# Patient Record
Sex: Female | Born: 1970 | Race: Black or African American | Hispanic: No | Marital: Single | Smoking: Never smoker
Health system: Southern US, Community
[De-identification: ages and names within clinical notes are randomized; demographics above are authoritative.]

## PROBLEM LIST (undated history)

## (undated) DIAGNOSIS — L738 Other specified follicular disorders: Secondary | ICD-10-CM

## (undated) DIAGNOSIS — L678 Other hair color and hair shaft abnormalities: Secondary | ICD-10-CM

## (undated) DIAGNOSIS — K5150 Left sided colitis without complications: Secondary | ICD-10-CM

## (undated) DIAGNOSIS — A64 Unspecified sexually transmitted disease: Secondary | ICD-10-CM

## (undated) HISTORY — DX: Unspecified sexually transmitted disease: A64

## (undated) HISTORY — DX: Left sided colitis without complications: K51.50

## (undated) HISTORY — DX: Other hair color and hair shaft abnormalities: L67.8

## (undated) HISTORY — DX: Other specified follicular disorders: L73.8

---

## 1999-03-23 ENCOUNTER — Emergency Department (HOSPITAL_COMMUNITY): Admission: EM | Admit: 1999-03-23 | Discharge: 1999-03-23 | Payer: Self-pay | Admitting: Emergency Medicine

## 1999-09-17 ENCOUNTER — Encounter (INDEPENDENT_AMBULATORY_CARE_PROVIDER_SITE_OTHER): Payer: Self-pay

## 1999-09-17 ENCOUNTER — Other Ambulatory Visit: Admission: RE | Admit: 1999-09-17 | Discharge: 1999-09-17 | Payer: Self-pay | Admitting: Gynecology

## 2001-10-24 ENCOUNTER — Other Ambulatory Visit: Admission: RE | Admit: 2001-10-24 | Discharge: 2001-10-24 | Payer: Self-pay | Admitting: Gynecology

## 2002-10-31 ENCOUNTER — Other Ambulatory Visit: Admission: RE | Admit: 2002-10-31 | Discharge: 2002-10-31 | Payer: Self-pay | Admitting: Gynecology

## 2003-12-05 ENCOUNTER — Other Ambulatory Visit: Admission: RE | Admit: 2003-12-05 | Discharge: 2003-12-05 | Payer: Self-pay | Admitting: Gynecology

## 2005-01-07 ENCOUNTER — Other Ambulatory Visit: Admission: RE | Admit: 2005-01-07 | Discharge: 2005-01-07 | Payer: Self-pay | Admitting: Gynecology

## 2005-07-26 ENCOUNTER — Ambulatory Visit: Payer: Self-pay | Admitting: Endocrinology

## 2005-07-26 ENCOUNTER — Ambulatory Visit: Payer: Self-pay | Admitting: Gastroenterology

## 2005-07-29 ENCOUNTER — Ambulatory Visit: Payer: Self-pay | Admitting: Gastroenterology

## 2005-07-29 ENCOUNTER — Encounter (INDEPENDENT_AMBULATORY_CARE_PROVIDER_SITE_OTHER): Payer: Self-pay | Admitting: *Deleted

## 2005-08-24 ENCOUNTER — Ambulatory Visit: Payer: Self-pay | Admitting: Gastroenterology

## 2006-01-10 ENCOUNTER — Ambulatory Visit: Payer: Self-pay | Admitting: Gastroenterology

## 2006-01-10 ENCOUNTER — Other Ambulatory Visit: Admission: RE | Admit: 2006-01-10 | Discharge: 2006-01-10 | Payer: Self-pay | Admitting: Gynecology

## 2006-05-10 ENCOUNTER — Ambulatory Visit: Payer: Self-pay | Admitting: Internal Medicine

## 2006-09-01 ENCOUNTER — Emergency Department (HOSPITAL_COMMUNITY): Admission: EM | Admit: 2006-09-01 | Discharge: 2006-09-01 | Payer: Self-pay | Admitting: Emergency Medicine

## 2006-11-27 DIAGNOSIS — A64 Unspecified sexually transmitted disease: Secondary | ICD-10-CM

## 2006-11-27 HISTORY — DX: Unspecified sexually transmitted disease: A64

## 2007-01-12 ENCOUNTER — Other Ambulatory Visit: Admission: RE | Admit: 2007-01-12 | Discharge: 2007-01-12 | Payer: Self-pay | Admitting: Gynecology

## 2007-01-18 ENCOUNTER — Emergency Department (HOSPITAL_COMMUNITY): Admission: EM | Admit: 2007-01-18 | Discharge: 2007-01-18 | Payer: Self-pay | Admitting: Family Medicine

## 2007-02-22 ENCOUNTER — Ambulatory Visit: Payer: Self-pay | Admitting: Gastroenterology

## 2007-03-14 ENCOUNTER — Encounter: Payer: Self-pay | Admitting: Gastroenterology

## 2007-03-14 ENCOUNTER — Ambulatory Visit: Payer: Self-pay | Admitting: Gastroenterology

## 2007-03-21 ENCOUNTER — Ambulatory Visit: Payer: Self-pay | Admitting: Internal Medicine

## 2007-03-27 ENCOUNTER — Ambulatory Visit: Payer: Self-pay | Admitting: Gastroenterology

## 2007-04-04 ENCOUNTER — Ambulatory Visit: Payer: Self-pay | Admitting: Gastroenterology

## 2007-04-18 ENCOUNTER — Ambulatory Visit: Payer: Self-pay | Admitting: Gastroenterology

## 2007-04-21 ENCOUNTER — Emergency Department (HOSPITAL_COMMUNITY): Admission: EM | Admit: 2007-04-21 | Discharge: 2007-04-21 | Payer: Self-pay | Admitting: Family Medicine

## 2007-05-02 ENCOUNTER — Ambulatory Visit: Payer: Self-pay | Admitting: Gastroenterology

## 2007-05-08 ENCOUNTER — Telehealth (INDEPENDENT_AMBULATORY_CARE_PROVIDER_SITE_OTHER): Payer: Self-pay | Admitting: *Deleted

## 2007-05-17 ENCOUNTER — Ambulatory Visit: Payer: Self-pay | Admitting: Gastroenterology

## 2007-08-14 DIAGNOSIS — K5150 Left sided colitis without complications: Secondary | ICD-10-CM | POA: Insufficient documentation

## 2007-10-24 IMAGING — CR DG ANKLE COMPLETE 3+V*R*
2 series · 2 of 2 positions shown · non-contrast
Comparison: none

CLINICAL DATA: 35-year-old, fell. 
 RIGHT ANKLE - 3 VIEW:

[view not recorded (1 of 2)]
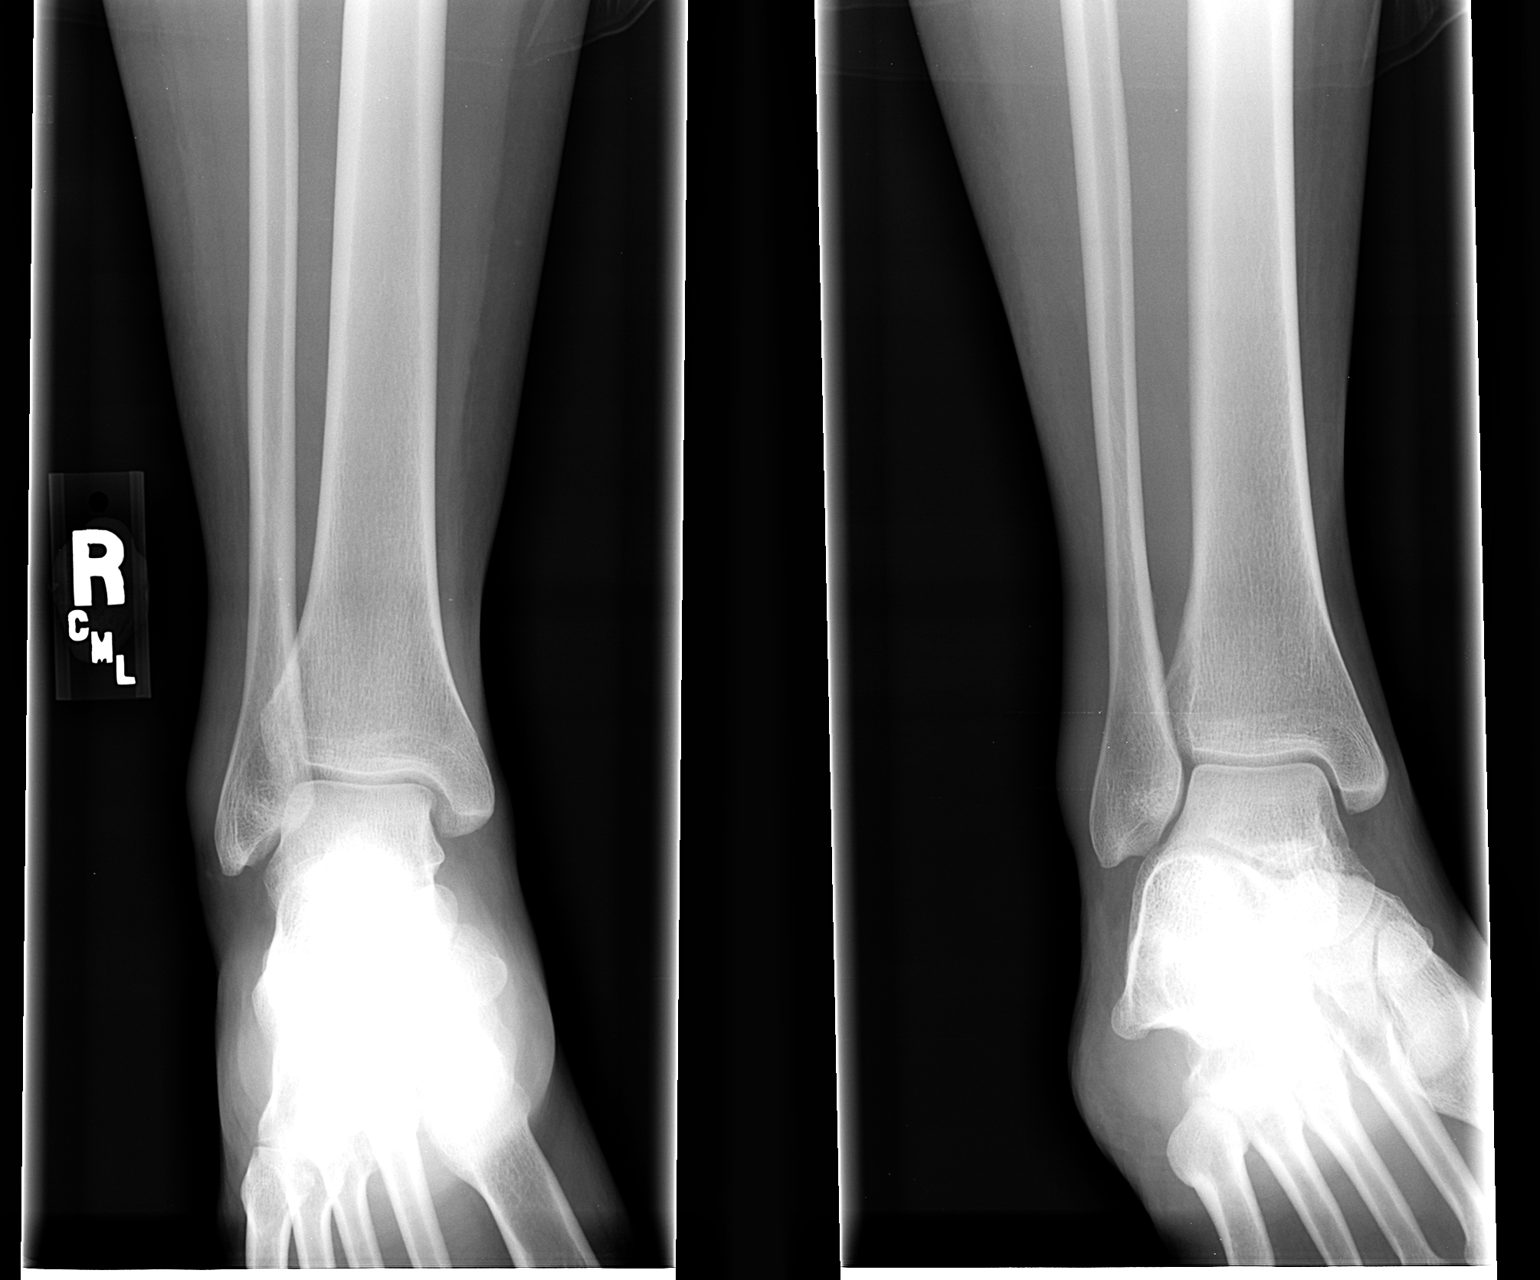

[view not recorded (2 of 2)]
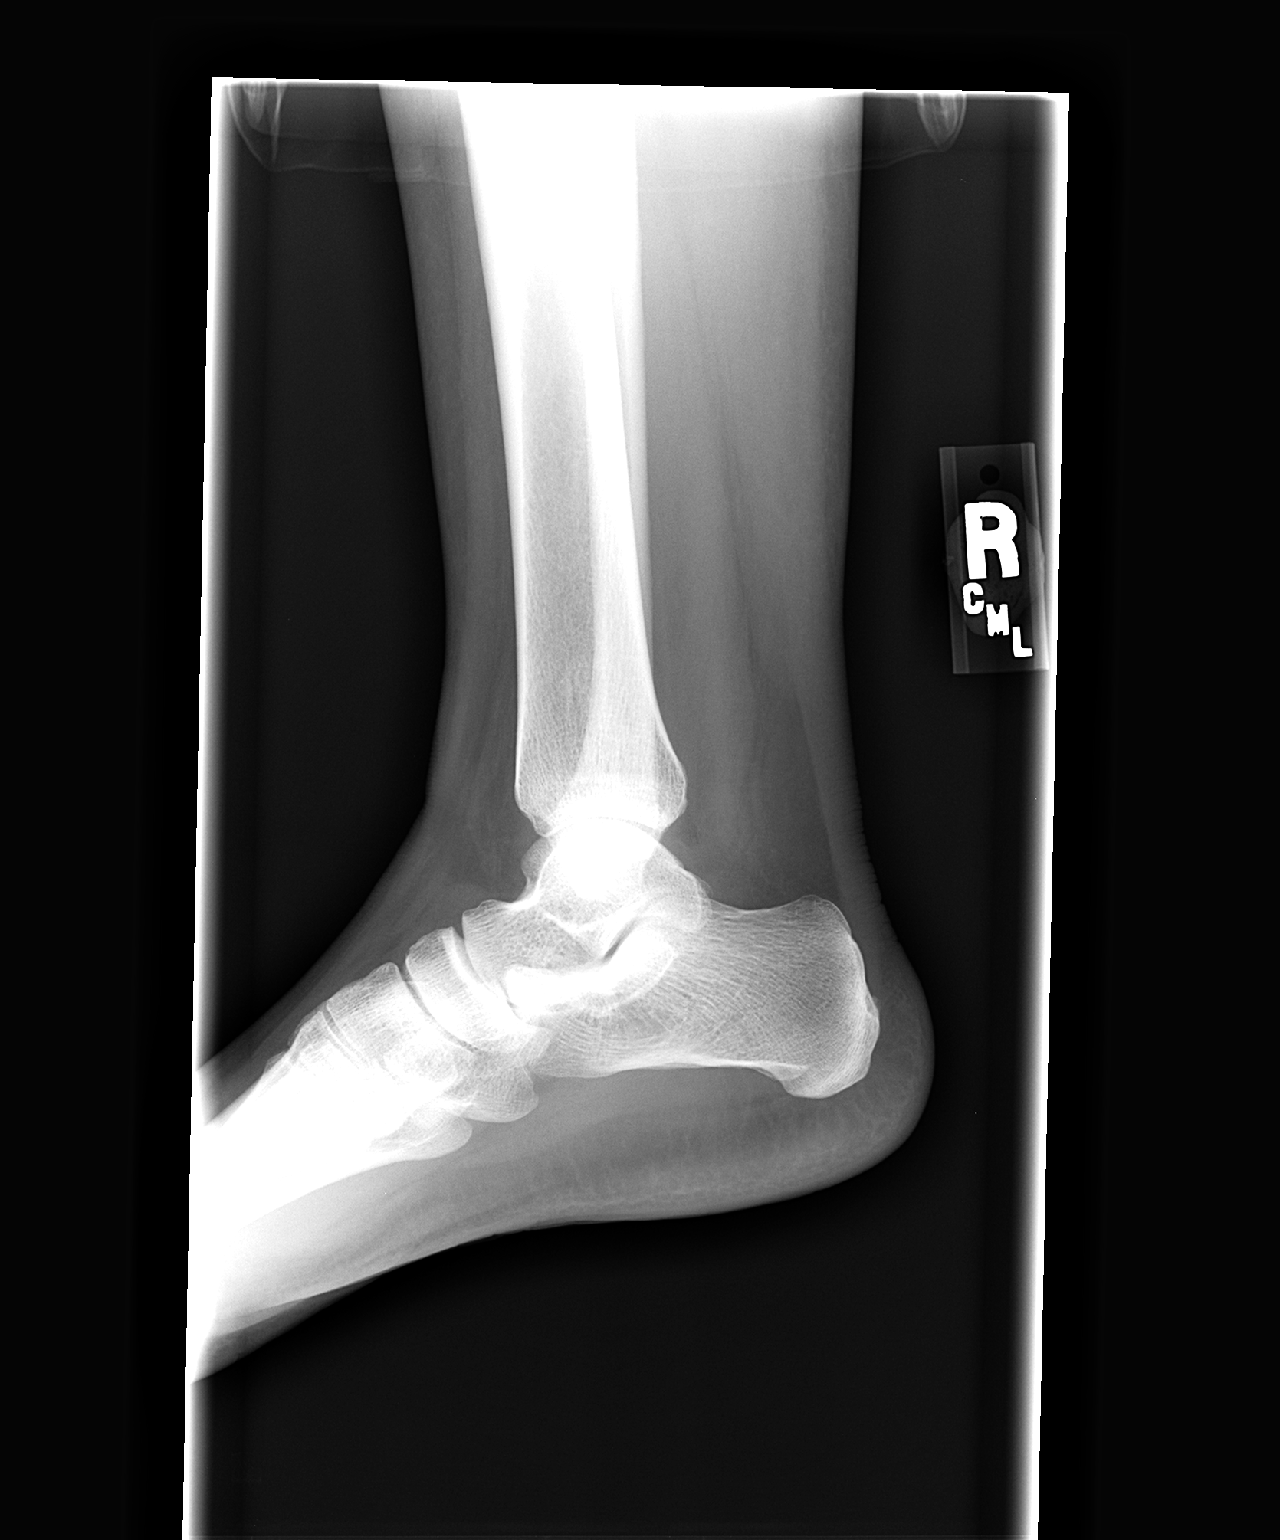

[2 of 2 positions shown; findings below may reference images not displayed]

FINDINGS: There is no evidence of fracture, dislocation, or joint effusion.  There is no evidence of arthropathy or other focal bone abnormality.  Soft tissues are unremarkable.
IMPRESSION: Negative.

## 2007-11-24 ENCOUNTER — Ambulatory Visit: Payer: Self-pay | Admitting: Endocrinology

## 2007-11-24 DIAGNOSIS — L738 Other specified follicular disorders: Secondary | ICD-10-CM | POA: Insufficient documentation

## 2008-01-18 ENCOUNTER — Other Ambulatory Visit: Admission: RE | Admit: 2008-01-18 | Discharge: 2008-01-18 | Payer: Self-pay | Admitting: Gynecology

## 2008-02-08 ENCOUNTER — Telehealth: Payer: Self-pay | Admitting: Gastroenterology

## 2008-05-02 ENCOUNTER — Ambulatory Visit: Payer: Self-pay | Admitting: Women's Health

## 2009-02-05 ENCOUNTER — Other Ambulatory Visit: Admission: RE | Admit: 2009-02-05 | Discharge: 2009-02-05 | Payer: Self-pay | Admitting: Gynecology

## 2009-02-05 ENCOUNTER — Ambulatory Visit: Payer: Self-pay | Admitting: Women's Health

## 2009-02-05 ENCOUNTER — Encounter: Payer: Self-pay | Admitting: Women's Health

## 2009-04-10 ENCOUNTER — Encounter: Payer: Self-pay | Admitting: Internal Medicine

## 2009-04-10 ENCOUNTER — Ambulatory Visit: Payer: Self-pay

## 2009-04-10 ENCOUNTER — Ambulatory Visit: Payer: Self-pay | Admitting: Endocrinology

## 2009-04-10 DIAGNOSIS — M79609 Pain in unspecified limb: Secondary | ICD-10-CM

## 2009-04-10 DIAGNOSIS — M25569 Pain in unspecified knee: Secondary | ICD-10-CM

## 2009-05-05 ENCOUNTER — Ambulatory Visit: Payer: Self-pay | Admitting: Gastroenterology

## 2009-05-05 ENCOUNTER — Encounter: Payer: Self-pay | Admitting: Endocrinology

## 2009-08-26 ENCOUNTER — Telehealth: Payer: Self-pay | Admitting: Gastroenterology

## 2010-01-15 ENCOUNTER — Ambulatory Visit: Payer: Self-pay | Admitting: Endocrinology

## 2010-01-15 DIAGNOSIS — D509 Iron deficiency anemia, unspecified: Secondary | ICD-10-CM

## 2010-01-15 DIAGNOSIS — B351 Tinea unguium: Secondary | ICD-10-CM

## 2010-01-15 DIAGNOSIS — E7800 Pure hypercholesterolemia, unspecified: Secondary | ICD-10-CM

## 2010-01-15 LAB — CONVERTED CEMR LAB
ALT: 16 units/L (ref 0–35)
AST: 14 units/L (ref 0–37)
BUN: 10 mg/dL (ref 6–23)
Basophils Relative: 0.5 % (ref 0.0–3.0)
Bilirubin, Direct: 0.1 mg/dL (ref 0.0–0.3)
Calcium: 9.3 mg/dL (ref 8.4–10.5)
Creatinine, Ser: 0.7 mg/dL (ref 0.4–1.2)
Eosinophils Relative: 2.3 % (ref 0.0–5.0)
GFR calc non Af Amer: 114.84 mL/min (ref >60)
Glucose, Bld: 83 mg/dL (ref 70–99)
HCT: 34.4 % — ABNORMAL LOW (ref 36.0–46.0)
HDL: 79.4 mg/dL (ref >39.00)
Lymphs Abs: 2.1 10*3/uL (ref 0.7–4.0)
MCV: 93.9 fL (ref 78.0–100.0)
Monocytes Absolute: 0.4 10*3/uL (ref 0.1–1.0)
Neutro Abs: 5 10*3/uL (ref 1.4–7.7)
RBC: 3.66 M/uL — ABNORMAL LOW (ref 3.87–5.11)
Total Bilirubin: 0.4 mg/dL (ref 0.3–1.2)
WBC: 7.7 10*3/uL (ref 4.5–10.5)

## 2010-01-16 ENCOUNTER — Ambulatory Visit: Payer: Self-pay | Admitting: Endocrinology

## 2010-01-19 ENCOUNTER — Telehealth: Payer: Self-pay | Admitting: Internal Medicine

## 2010-02-17 ENCOUNTER — Encounter: Payer: Self-pay | Admitting: Endocrinology

## 2010-03-05 ENCOUNTER — Encounter: Payer: Self-pay | Admitting: Endocrinology

## 2010-04-11 ENCOUNTER — Ambulatory Visit: Payer: Self-pay | Admitting: Family Medicine

## 2010-04-11 DIAGNOSIS — J069 Acute upper respiratory infection, unspecified: Secondary | ICD-10-CM | POA: Insufficient documentation

## 2010-04-15 ENCOUNTER — Ambulatory Visit: Payer: Self-pay | Admitting: Women's Health

## 2010-04-15 ENCOUNTER — Other Ambulatory Visit: Admission: RE | Admit: 2010-04-15 | Discharge: 2010-04-15 | Payer: Self-pay | Admitting: Gynecology

## 2010-07-28 NOTE — Assessment & Plan Note (Signed)
Summary: FU / NWS  #   Vital Signs:  Patient profile:   40 year old female Height:      61 inches (154.94 cm) Weight:      123.25 pounds (56.02 kg) BMI:     23.37 O2 Sat:      97 % on Room air Temp:     98.1 degrees F (36.72 degrees C) oral Pulse rate:   61 / minute BP sitting:   102 / 68  (left arm) Cuff size:   regular  Vitals Entered By: Brenton Grills MA (January 15, 2010 4:14 PM)  O2 Flow:  Room air CC: F/U appt/TB test and blood glucose tes per pt/aj   Primary Provider:  Romero Belling, MD  CC:  F/U appt/TB test and blood glucose tes per pt/aj.  History of Present Illness: pt states she feels well in general.   pt has h/o fe-deficiency anemia. she takes no med for cholesterol.  Current Medications (verified): 1)  Lialda 1.2 Gm Tbec (Mesalamine) .Marland Kitchen.. 1 By Mouth Once Daily 2)  Multivitamins  Tabs (Multiple Vitamin) .Marland Kitchen.. 1 Tablet By Mouth Once Daily  Allergies (verified): 1)  ! 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 Occupation: Community education officer.  also student at [employer] Patient has never smoked.  Alcohol Use - no Daily Caffeine Use Illicit Drug Use - no  Review of Systems  The patient denies fever.    Physical Exam  General:  normal appearance.   Neck:  Supple without thyroid enlargement or tenderness.  Psych:  Alert and cooperative; normal mood and affect; normal attention span and concentration.     Impression & Recommendations:  Problem # 1:  HYPERCHOLESTEROLEMIA (ICD-272.0) Assessment New  Problem # 2:  ANEMIA, IRON DEFICIENCY (ICD-280.9) needs increased rx usually due to menstruation  Problem # 3:  occupational visit  Medications Added to Medication List This Visit: 1)  Multivitamins Tabs (Multiple vitamin) .Marland Kitchen.. 1  tablet by mouth once daily  Other Orders: TLB-CBC Platelet - w/Differential (85025-CBCD) TLB-BMP (Basic Metabolic Panel-BMET) (80048-METABOL) TLB-TSH (Thyroid Stimulating Hormone) (84443-TSH) TLB-Lipid Panel (80061-LIPID) TLB-Hepatic/Liver Function Pnl (80076-HEPATIC) Est. Patient Level III (04540)  Preventive Care Screening     gyn is dr Ginette Otto gyn   Patient Instructions: 1)  blood tests are being ordered for you today.  please call [phone] to hear your test results. 2)  nurse appointment tomorrow for ppd. 3)  return monday for reading ppd. 4)  (update: i left message on phone-tree:  consider pravachol.  take fe 2/day.  go to lab in 1 month for fe and cbc)

## 2010-07-28 NOTE — Assessment & Plan Note (Signed)
Summary: EAR INFECTION/DLO   Vital Signs:  Patient profile:   40 year old female Weight:      124 pounds Temp:     99.2 degrees F oral Pulse rate:   60 / minute Pulse rhythm:   regular BP sitting:   120 / 78  (right arm) Cuff size:   regular  Vitals Entered By: Lowella Petties CMA (April 11, 2010 11:01 AM) CC: Right ear hurting, some dizziness, drainage in throat   Primary Provider:  Romero Belling, MD   History of Present Illness: 40 year old female:  R ear pain fullness some dizziness and sinus symptoms with drainage  minimal cough no n/v/d AF  eating and drinking normally  ROS as above  GEN: WDWN, NAD; alert,appropriate and cooperative throughout exam HEENT: Normocephalic and atraumatic. Throat clear, w/o exudate, no LAD, R slightly indistinct landmarks, minimal bulging, L TM grey, good landmarks. Left frontal and maxillary sinuses: NT Right frontal and maxillary sinuses: NT NECK: No ant or post LAD CV: RRR, No M/G/R PULM: no resp distress, no accessory muscles.  No retractions. no w/c/r ABD: S,NT,ND,+BS, No HSM EXTR: no c/c/e PSYCH: full affect, pleasant, conversant   Allergies: 1)  ! * Minocycline 2)  Doxycycline Hyclate (Doxycycline Hyclate)   Impression & Recommendations:  Problem # 1:  URI (ICD-465.9) Probably viral URI with serous fluid, some obscuration of landmarks.  supportive care for now if she worsens, fill abx early next week  Complete Medication List: 1)  Lialda 1.2 Gm Tbec (Mesalamine) .Marland Kitchen.. 1 by mouth once daily 2)  Multivitamins Tabs (Multiple vitamin) .Marland Kitchen.. 1 tablet by mouth once daily 3)  Pravachol 20 Mg Tabs (Pravastatin sodium) .Marland Kitchen.. 1 by mouth at bedtime 4)  Amoxicillin 500 Mg Tabs (Amoxicillin) .... 3 tabs by mouth two times a day (high dose om)  Patient Instructions: 1)  Otitis Media 2)  -Middle Ear Infection 3)  Fluid builds up in middle ear 4)  Pressure builds up and causes discomfort 5)  Can be caused by viruses or  bacteria 6)  Treatment: 7)  1. Relieve ear pressure: Oral decongestants -- Sudafed (pseudoephedrine or phenylephrine): CAUTION if HIGH BLOOD PRESSURE 8)  Nasal Decongestants: Afrin nasal spray can help. But don't use more than 3 days in a row. 9)  2. Take prescribed antibiotics, take the whole bottle until empty  Prescriptions: AMOXICILLIN 500 MG TABS (AMOXICILLIN) 3 tabs by mouth two times a day (high dose OM)  #60 x 0   Entered and Authorized by:   Hannah Beat MD   Signed by:   Hannah Beat MD on 04/11/2010   Method used:   Print then Give to Patient   RxID:   [id number]    Orders Added: 1)  Est. Patient Level III [69629]

## 2010-07-28 NOTE — Progress Notes (Signed)
Summary: question about labs  Phone Note Call from Patient Call back at Home Phone [phone]   Caller: Patient Reason for Call: Lab or Test Results Details for Reason: Cholesterol ? Summary of Call: Pt called for F/U/clarification on lab results on cholesterol--wants to know if recommend rx for cholesterol or change of diet--please advise Initial call taken by: Brenton Grills MA,  January 19, 2010 10:39 AM  Follow-up for Phone Call        Per update in last OV, MD wanted pt to consider pravachol. Pt stated that she would like to start and request Rx to [facility] on [address].  Please advise on dosage in SAE's absence Follow-up by: Margaret Pyle, CMA,  January 22, 2010 10:48 AM  Additional Follow-up for Phone Call Additional follow up Details #1::        low dose pravachol 20mg  qhs - erx done - needs 3 mo ROV with SAE to recheck LFTs on new chol tx - thx! Additional Follow-up by: Newt Lukes MD,  January 22, 2010 12:19 PM    New/Updated Medications: PRAVACHOL 20 MG TABS (PRAVASTATIN SODIUM) 1 by mouth at bedtime Prescriptions: PRAVACHOL 20 MG TABS (PRAVASTATIN SODIUM) 1 by mouth at bedtime  #30 x 2   Entered by:   Newt Lukes MD   Authorized by:   Minus Breeding MD   Signed by:   Newt Lukes MD on 01/22/2010   Method used:   Electronically to        [facility] [address] [facility]       [address]       [place], [place]  [address]       Ph: [phone]       Fax: [phone]   RxID:   [id number]

## 2010-07-28 NOTE — Assessment & Plan Note (Signed)
Summary: tb skin test per dr Rajah Tagliaferro/8:15 am appt/cd   Nurse Visit   Allergies: 1)  ! * Minocycline 2)  Doxycycline Hyclate (Doxycycline Hyclate)  Immunizations Administered:  PPD Skin Test:    Vaccine Type: PPD    Site: right forearm    Mfr: Sanofi Pasteur    Dose: 0.1 ml    Route: ID    Given by: Margaret Pyle, CMA    Exp. Date: 11/23/2011    Lot #: [id number]  PPD Results    Date of reading: 01/19/2010    Results: < 5mm    Interpretation: negative  Orders Added: 1)  TB Skin Test [86580] 2)  Admin 1st Vaccine [69629]

## 2010-07-28 NOTE — Progress Notes (Signed)
Summary: TRIAGE   Phone Note Call from Patient Call back at Home Phone [phone]   Caller: Patient Call For: Dr. Arlyce Dice Reason for Call: Talk to Nurse Summary of Call: pt would like to know if it would be ok for her to do colon hydrotherapy Initial call taken by: Vallarie Mare,  August 26, 2009 9:40 AM  Follow-up for Phone Call        Pt. has Ulcerative Colitis, she wants to try a Colon Cleanse/Hydrotherapy.   [facility] PLEASE ADVISE  Follow-up by: Laureen Ochs LPN,  August 26, 2009 9:46 AM  Additional Follow-up for Phone Call Additional follow up Details #1::        I don't see any reason for this but ok Additional Follow-up by: Louis Meckel MD,  August 26, 2009 12:08 PM    Additional Follow-up for Phone Call Additional follow up Details #2::    Above MD orders reviewed with patient. Pt. instructed to call back as needed.  Follow-up by: Laureen Ochs LPN,  August 26, 2009 12:56 PM

## 2010-07-28 NOTE — Miscellaneous (Signed)
Summary: Flu Vaccination/[facility]  Flu Vaccination/[facility]   Imported By: Sherian Rein 03/10/2010 07:58:00  _____________________________________________________________________  External Attachment:    Type:   Image     Comment:   External Document

## 2010-07-28 NOTE — Letter (Signed)
Summary: Biometric Screening forms/[facility]  Biometric Screening forms/[facility]   Imported By: Sherian Rein 02/19/2010 10:50:31  _____________________________________________________________________  External Attachment:    Type:   Image     Comment:   External Document

## 2010-11-05 ENCOUNTER — Other Ambulatory Visit (INDEPENDENT_AMBULATORY_CARE_PROVIDER_SITE_OTHER): Payer: 59 | Admitting: Endocrinology

## 2010-11-05 ENCOUNTER — Encounter: Payer: Self-pay | Admitting: Endocrinology

## 2010-11-05 ENCOUNTER — Ambulatory Visit (INDEPENDENT_AMBULATORY_CARE_PROVIDER_SITE_OTHER): Payer: 59 | Admitting: Endocrinology

## 2010-11-05 ENCOUNTER — Other Ambulatory Visit (INDEPENDENT_AMBULATORY_CARE_PROVIDER_SITE_OTHER): Payer: 59

## 2010-11-05 DIAGNOSIS — E7800 Pure hypercholesterolemia, unspecified: Secondary | ICD-10-CM

## 2010-11-05 DIAGNOSIS — D509 Iron deficiency anemia, unspecified: Secondary | ICD-10-CM

## 2010-11-05 DIAGNOSIS — Z79899 Other long term (current) drug therapy: Secondary | ICD-10-CM

## 2010-11-05 DIAGNOSIS — Z13220 Encounter for screening for lipoid disorders: Secondary | ICD-10-CM

## 2010-11-05 DIAGNOSIS — K769 Liver disease, unspecified: Secondary | ICD-10-CM

## 2010-11-05 LAB — CBC WITH DIFFERENTIAL/PLATELET
Basophils Relative: 0.5 % (ref 0.0–3.0)
Eosinophils Relative: 2.1 % (ref 0.0–5.0)
HCT: 36.4 % (ref 36.0–46.0)
Hemoglobin: 12.1 g/dL (ref 12.0–15.0)
Lymphs Abs: 2.6 10*3/uL (ref 0.7–4.0)
Monocytes Relative: 6.2 % (ref 3.0–12.0)
Neutro Abs: 3.8 10*3/uL (ref 1.4–7.7)
RDW: 13.7 % (ref 11.5–14.6)

## 2010-11-05 MED ORDER — KETOCONAZOLE 2 % EX CREA: TOPICAL_CREAM | Freq: Every day | CUTANEOUS | Status: AC

## 2010-11-05 NOTE — Patient Instructions (Addendum)
Finish the course of lamisil Here is a prescription for ketoconazole cream, in case you need it, if the rash recurs.  You can also take lamisil cream. However, the cream doesn't work well n the toenail fungus. blood tests are being ordered for you today.  please call [phone] to hear your test results.  You will be prompted to enter the 9-digit "MRN" number that appears at the top left of this page, followed by #.  Then you will hear the message. (update: i left message on phone-tree:  Stop lamisil.  Go to lab in 1 month to repeat lft).

## 2010-11-05 NOTE — Progress Notes (Signed)
  Subjective:    Patient ID: Stacy Dunn, female    DOB: 1970-08-24, 40 y.o.   MRN: [id number]  HPI The state of at least three ongoing medical problems is addressed today: Onychomycosis: t was rx'ed lamisil last year, and restarted a course approx 2 weeks ago.  It helped acne and toenail onychomycosis. She takes fe 1 pill/d.  Menses are not very heavy.  She takes pravastatin as rx'ed.  Diet is good.  Past Medical History  Diagnosis Date  . Other specified disease of hair and hair follicles   . Left sided ulcerative (chronic) colitis     No past surgical history on file.  History   Social History  . Marital Status: Single    Spouse Name: N/A    Number of Children: N/A  . Years of Education: N/A   Occupational History  . Not on file.   Social History Main Topics  . Smoking status: Never Smoker   . Smokeless tobacco: Not on file   Comment: Daily Caffeine - NO  . Alcohol Use: Yes  . Drug Use: Yes  . Sexually Active:    Other Topics Concern  . Not on file   Social History Narrative  . No narrative on file    Current Outpatient Prescriptions on File Prior to Visit  Medication Sig Dispense Refill  . Multiple Vitamin (MULTIVITAMIN) tablet Take 1 tablet by mouth daily.        . pravastatin (PRAVACHOL) 20 MG tablet Take 20 mg by mouth at bedtime.          Allergies  Allergen Reactions  . Doxycycline Hyclate     REACTION: unspecified  . Minocycline     Family History  Problem Relation Age of Onset  . Cancer Neg Hx     Colon  . Diabetes Other     Grandmother    BP 122/78  Pulse 64  Temp(Src) 98.5 F (36.9 C) (Oral)  Ht 5\' 1"  (1.549 m)  Wt 124 lb 6.4 oz (56.427 kg)  BMI 23.50 kg/m2  SpO2 95%    Review of Systems Denies fever and brbpr    Objective:   Physical Exam GENERAL: no distress Skin:  i do not see a rash Feet:  i do not see onychomycosis    Lab Results  Component Value Date   WBC 7.0 11/05/2010   HGB 12.1 11/05/2010   HCT 36.4  11/05/2010   PLT 264.0 11/05/2010   CHOL 215* 11/05/2010   TRIG 42.0 11/05/2010   HDL 70.20 11/05/2010   LDLDIRECT 138.9 11/05/2010   ALT 27 11/05/2010   AST 59* 11/05/2010   NA 142 01/15/2010   K 4.2 01/15/2010   CL 110 01/15/2010   CREATININE 0.7 01/15/2010   BUN 10 01/15/2010   CO2 27 01/15/2010   TSH 0.94 01/15/2010      Assessment & Plan:  elev lft, new.  uncertain etiology.  lamisil will have to be stopped. Onychomycosis, improved. Rash, uncertain etiology.  Improved.  Dyslipidemia, needs increased rx.  ? Compliance. Anemia, improved

## 2010-11-06 LAB — HEPATIC FUNCTION PANEL
ALT: 27 U/L (ref 0–35)
AST: 59 U/L — ABNORMAL HIGH (ref 0–37)
Total Bilirubin: 0.8 mg/dL (ref 0.3–1.2)

## 2010-11-06 LAB — LIPID PANEL
Cholesterol: 215 mg/dL — ABNORMAL HIGH (ref 0–200)
HDL: 70.2 mg/dL (ref >39.00)
Total CHOL/HDL Ratio: 3
Triglycerides: 42 mg/dL (ref 0.0–149.0)
VLDL: 8.4 mg/dL (ref 0.0–40.0)

## 2010-11-06 LAB — IBC PANEL
Iron: 33 ug/dL — ABNORMAL LOW (ref 42–145)
Saturation Ratios: 9.4 % — ABNORMAL LOW (ref 20.0–50.0)
Transferrin: 249.6 mg/dL (ref 212.0–360.0)

## 2010-11-06 LAB — LDL CHOLESTEROL, DIRECT: Direct LDL: 138.9 mg/dL

## 2010-11-07 DIAGNOSIS — K769 Liver disease, unspecified: Secondary | ICD-10-CM | POA: Insufficient documentation

## 2010-11-10 NOTE — Assessment & Plan Note (Signed)
[facility]                         GASTROENTEROLOGY OFFICE NOTE   Stacy, Dunn                      MRN:          [id number]  DATE:02/22/2007                            DOB:          1971/06/08    PROBLEM:  Left-sided colitis.   Stacy Dunn has returned for reevaluation.  She has well established  left-sided colitis.  She takes Colazal __________750mg  3 times a day.  Unfortunately, she is moderately symptomatic.  She has 2-6 loose stools  a day accompanied by abdominal pain and urgency.  She has seen various  amounts of blood mixed with her stools.   Currently, other medications are Citrucel.   She is allergic to TETRACYCLINE.   PHYSICAL EXAMINATION:  Pulse 64, blood pressure 118/64, weight 119.  HEENT: EOMI.  PERRLA.  Sclerae are anicteric.  Conjunctivae are pink.  NECK:  Supple without thyromegaly, adenopathy or carotid bruits.  CHEST:  Clear to auscultation and percussion without adventitious  sounds.  CARDIAC:  Regular rhythm; normal S1 S2.  There are no murmurs, gallops  or rubs.  ABDOMEN:  Bowel sounds are normoactive.  Abdomen is soft, nontender and  nondistended.  There are no abdominal masses, tenderness, splenic  enlargement or hepatomegaly.  EXTREMITIES:  Full range of motion.  No cyanosis, clubbing or edema.  RECTAL:  Deferred.   IMPRESSION:  Active, left-sided colitis.   RECOMMENDATION:  The patient will consider enrollment in an inflammatory  bowel disease trial.  Failing that, I will start her on prednisone and 5-  ASA enemas.     Stacy Dunn. Stacy Dice, MD,FACG  Electronically Signed    RDK/MedQ  DD: 02/22/2007  DT: 02/23/2007  Job #: [id number]

## 2010-11-10 NOTE — Assessment & Plan Note (Signed)
[facility]                         GASTROENTEROLOGY OFFICE NOTE   SHERMEKA, RUTT                      MRN:          [id number]  DATE:05/17/2007                            DOB:          1970/10/24    PROBLEM:  Left-sided colitis.    Ms. Dial has returned for a scheduled GI followup.  Stacy Dunn completed a  IBD trial.  Currently, Stacy Dunn is on Colazal 250 mg 3 times a day.  Stacy Dunn is  feeling well.  Specifically, Stacy Dunn is without pain, diarrhea, or rectal  bleeding.   EXAM:  Pulse 76, blood pressure 104/62, weight 118.   IMPRESSION:  Left-sided colitis, in remission.   RECOMMENDATIONS:  As per the patient's request, I am going to switch her  to Lialda 2.4 g daily.     Barbette Hair. Arlyce Dice, MD,FACG  Electronically Signed    RDK/MedQ  DD: 05/17/2007  DT: 05/17/2007  Job #: [id number]

## 2010-11-13 NOTE — Assessment & Plan Note (Signed)
[facility]                           GASTROENTEROLOGY OFFICE NOTE   Stacy, Dunn                      MRN:          [id number]  DATE:01/10/2006                            DOB:          10/29/1970    PROBLEMS:  Left sided colitis.   HISTORY OF PRESENT ILLNESS:  Stacy Dunn has returned for scheduled follow  up.  She has had flare ups consisting of urgency and rectal bleeding.  She  took Rowasa suppositories with relief.  Since stopping the suppositories  several weeks ago, she has had recurrence of her symptoms.  She remains on  Colazal.   PHYSICAL EXAMINATION:  VITAL SIGNS:  Pulse 72, blood pressure 100/68, weight  122.   IMPRESSION:  Left sided colitis, moderately active.   RECOMMENDATIONS:  1.  Continue Colazal.  2.  Restart Rowasa suppositories (The patient does not tolerate enemas.) one      q.h.s. x14 days.  At that point, I instructed her to discontinue the      suppositories.  If symptoms recur, then she was instructed to use the      suppositories every other day or every third day as maintenance.                                   Barbette Hair. Arlyce Dice, MD, [facility]   RDK/MedQ  DD:  01/10/2006  DT:  01/10/2006  Job #:  [id number]

## 2011-01-11 ENCOUNTER — Ambulatory Visit (INDEPENDENT_AMBULATORY_CARE_PROVIDER_SITE_OTHER): Payer: 59 | Admitting: Women's Health

## 2011-01-11 DIAGNOSIS — R1031 Right lower quadrant pain: Secondary | ICD-10-CM

## 2011-01-11 DIAGNOSIS — N949 Unspecified condition associated with female genital organs and menstrual cycle: Secondary | ICD-10-CM

## 2011-01-11 DIAGNOSIS — Z113 Encounter for screening for infections with a predominantly sexual mode of transmission: Secondary | ICD-10-CM

## 2011-01-11 DIAGNOSIS — B373 Candidiasis of vulva and vagina: Secondary | ICD-10-CM

## 2011-01-15 ENCOUNTER — Other Ambulatory Visit: Payer: 59

## 2011-01-15 ENCOUNTER — Ambulatory Visit (INDEPENDENT_AMBULATORY_CARE_PROVIDER_SITE_OTHER): Payer: 59 | Admitting: Women's Health

## 2011-01-15 ENCOUNTER — Other Ambulatory Visit: Payer: Self-pay | Admitting: Gastroenterology

## 2011-01-15 DIAGNOSIS — N949 Unspecified condition associated with female genital organs and menstrual cycle: Secondary | ICD-10-CM

## 2011-01-15 DIAGNOSIS — N930 Postcoital and contact bleeding: Secondary | ICD-10-CM

## 2011-02-03 ENCOUNTER — Ambulatory Visit (INDEPENDENT_AMBULATORY_CARE_PROVIDER_SITE_OTHER): Payer: 59 | Admitting: Gynecology

## 2011-02-03 ENCOUNTER — Other Ambulatory Visit: Payer: Self-pay

## 2011-02-03 ENCOUNTER — Other Ambulatory Visit: Payer: 59

## 2011-02-03 ENCOUNTER — Encounter: Payer: Self-pay | Admitting: Gynecology

## 2011-02-03 DIAGNOSIS — N949 Unspecified condition associated with female genital organs and menstrual cycle: Secondary | ICD-10-CM

## 2011-02-03 DIAGNOSIS — N930 Postcoital and contact bleeding: Secondary | ICD-10-CM

## 2011-02-03 DIAGNOSIS — N8300 Follicular cyst of ovary, unspecified side: Secondary | ICD-10-CM

## 2011-02-03 DIAGNOSIS — N938 Other specified abnormal uterine and vaginal bleeding: Secondary | ICD-10-CM

## 2011-02-03 NOTE — Progress Notes (Signed)
Patient is a 40 year old gravida 2 para 1 AB 1 who presented to the office today for an ultrasound/sono hysterogram as a result of recent episodes of postcoital bleeding. Her recent Pap smear was in October 2011 which was normal. She was recently treated for a yeast infection. She stated that she has spotted twice last month to cycle has not had any recurrence.  Sonohysterogram: Normal size uterus with normal-appearing ovaries sonohysterogram were no intracavitary defect.  Labs: A TSH and prolactin will be ordered today to complete the evaluation for her DUB. Endometrial biopsy was done today the sterile fashion minimal to moderate amount of tissue was obtained and was submitted for histological evaluation.  Assessment: Isolated event of breakthrough bleeding. Patient using condoms for contraception. Will notify her of there is any abnormality in of the blood test and endometrial biopsy. Otherwise she'll keep a menstrual calendar until the time of her annual exam when she returns back in October of this year.

## 2011-02-03 NOTE — Patient Instructions (Signed)
Stacy Dunn, today will be drawn your thyroid function tests as well as her prolactin level. Will notify you if there is any abnormality of any of these tests. Please keep a menstrual calendar over the course of the next 6 months. We'll also notify you if there is any abnormality on the endometrial biopsy. Her ultrasound today was essentially normal and reassuring. If symptoms worsen over the course of the next 6 Months Pl. for 3 to return back to the office.

## 2011-02-26 ENCOUNTER — Encounter: Payer: Self-pay | Admitting: Endocrinology

## 2011-02-26 ENCOUNTER — Ambulatory Visit (INDEPENDENT_AMBULATORY_CARE_PROVIDER_SITE_OTHER): Payer: 59 | Admitting: Endocrinology

## 2011-02-26 VITALS — BP 106/70 | HR 72 | Temp 98.2°F | Resp 16 | Wt 117.0 lb

## 2011-02-26 DIAGNOSIS — J358 Other chronic diseases of tonsils and adenoids: Secondary | ICD-10-CM | POA: Insufficient documentation

## 2011-02-26 MED ORDER — AZITHROMYCIN 500 MG PO TABS: 500.0000 mg | ORAL_TABLET | Freq: Every day | ORAL | Status: AC

## 2011-02-26 NOTE — Patient Instructions (Addendum)
i have sent a prescription to your pharmacy, for an antibiotic. Refer to an ear-nose-throat doctor.  you will receive a phone call, about a day and time for an appointment

## 2011-02-26 NOTE — Progress Notes (Signed)
  Subjective:    Patient ID: Stacy Dunn, female    DOB: 01/04/1971, 40 y.o.   MRN: [id number]  HPI Pt states 1 month of solid debris from the tonsils, and assoc pain.  She says she also has pain at the left ear. Past Medical History  Diagnosis Date  . Other specified disease of hair and hair follicles   . Left sided ulcerative (chronic) colitis   . 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  History   Social History  . Marital Status: Single    Spouse Name: N/A    Number of Children: N/A  . Years of Education: N/A   Occupational History  . Not on file.   Social History Main Topics  . Smoking status: Never Smoker   . Smokeless tobacco: Not on file   Comment: Daily Caffeine - NO  . Alcohol Use: Yes  . Drug Use: Yes  . Sexually Active:    Other Topics Concern  . Not on file   Social History Narrative  . No narrative on file    Current Outpatient Prescriptions on File Prior to Visit  Medication Sig Dispense Refill  . Green Tea, Camillia sinensis, ([facility] GREEN TEA EXTRACT PO) Take 1 tablet by mouth daily.        Marland Kitchen ketoconazole (NIZORAL) 2 % cream Apply topically daily.  30 g  2  . LIALDA 1.2 G EC tablet TAKE 1 TABLET BY MOUTH EVERY DAY  30 tablet  0  . Milk Thistle 140 MG CAPS Take 1 capsule by mouth daily.        . Multiple Vitamin (MULTIVITAMIN) tablet Take 1 tablet by mouth daily.        . NON FORMULARY Tumeric  1 by mouth once daily       . NON FORMULARY Hyalomic Acid  1 by mouth once daily         Allergies  Allergen Reactions  . Doxycycline Hyclate     REACTION: unspecified  . Minocycline     Family History  Problem Relation Age of Onset  . Diabetes Other     Grandmother  . Heart disease Brother     BYPASS AT AGE 76  . Hypertension Brother    BP 106/70  Pulse 72  Temp(Src) 98.2 F (36.8 C) (Oral)  Resp 16  Wt 117 lb (53.071 kg)  Review of Systems Denies pain and nasal congestion.      Objective:   Physical Exam VITAL SIGNS:  See vs page GENERAL: no distress. head: no deformity. eyes: no periorbital swelling, no proptosis external nose and ears are normal. mouth: no lesion seen.  tonsils are nor visualized due to guarding and gag reflex. Both eac's and tm's are normal. There is no palpable thyroid enlargement.  No thyroid nodule is palpable.  No palpable lymphadenopathy at the anterior neck.     Assessment & Plan:  tonsilar stone, by hx

## 2011-03-15 ENCOUNTER — Encounter: Payer: Self-pay | Admitting: Gynecology

## 2011-04-23 ENCOUNTER — Encounter: Payer: Self-pay | Admitting: Women's Health

## 2011-04-23 ENCOUNTER — Other Ambulatory Visit (HOSPITAL_COMMUNITY)
Admission: RE | Admit: 2011-04-23 | Discharge: 2011-04-23 | Disposition: A | Discharge status: Home / Self Care | Payer: 59 | Source: Ambulatory Visit | Attending: Gynecology | Admitting: Gynecology

## 2011-04-23 ENCOUNTER — Ambulatory Visit (INDEPENDENT_AMBULATORY_CARE_PROVIDER_SITE_OTHER): Payer: 59 | Admitting: Women's Health

## 2011-04-23 VITALS — BP 110/70 | Ht 61.75 in | Wt 116.0 lb

## 2011-04-23 DIAGNOSIS — Z01419 Encounter for gynecological examination (general) (routine) without abnormal findings: Secondary | ICD-10-CM | POA: Insufficient documentation

## 2011-04-23 DIAGNOSIS — Z13220 Encounter for screening for lipoid disorders: Secondary | ICD-10-CM

## 2011-04-23 DIAGNOSIS — R82998 Other abnormal findings in urine: Secondary | ICD-10-CM

## 2011-04-23 NOTE — Progress Notes (Signed)
Stacy Dunn [birth year] [id number]    History:    The patient presents for annual exam.  Works at [employer].  Stacy Dunn, 15 and doing well. Grandmother lives with her   Past medical history, past surgical history, family history and social history were all reviewed and documented in the EPIC chart.   ROS:  A  ROS was performed and pertinent positives and negatives are included in the history.  Exam:  Filed Vitals:   04/23/11 0917  BP: 110/70    General appearance:  Normal Head/Neck:  Normal, without cervical or supraclavicular adenopathy. Thyroid:  Symmetrical, normal in size, without palpable masses or nodularity. Respiratory  Effort:  Normal  Auscultation:  Clear without wheezing or rhonchi Cardiovascular  Auscultation:  Regular rate, without rubs, murmurs or gallops  Edema/varicosities:  Not grossly evident Abdominal  Soft,nontender, without masses, guarding or rebound.  Liver/spleen:  No organomegaly noted  Hernia:  None appreciated  Skin  Inspection:  Grossly normal  Palpation:  Grossly normal Neurologic/psychiatric  Orientation:  Normal with appropriate conversation.  Mood/affect:  Normal  Genitourinary    Breasts: Examined lying and sitting.     Right: Without masses, retractions, discharge or axillary adenopathy.     Left: Without masses, retractions, discharge or axillary adenopathy.   Inguinal/mons:  Normal without inguinal adenopathy  External genitalia:  Normal  BUS/Urethra/Skene's glands:  Normal  Bladder:  Normal  Vagina:  Normal  Cervix:  Normal  Uterus:  normal in size, shape and contour.  Midline and mobile  Adnexa/parametria:     Rt: Without masses or tenderness.   Lt: Without masses or tenderness.  Anus and perineum: Normal  Digital rectal exam: Normal sphincter tone without palpated masses or tenderness  Assessment/Plan:  40 y.o. DBF G2P1 for annual exam with no complaints. Monthly 5-7 day cycle/condoms. Same partner 13 years. States  has gone with a soy free diet and has had less GI problems, does have a history of ulcerative colitis.  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 SBEs, annual mammogram at 41. Encouraged exercise, MVI daily calcium rich diet. CBC, lipid profile, UA and Pap   Stacy Dunn [facility], 9:52 AM 04/23/2011

## 2011-05-01 ENCOUNTER — Telehealth: Payer: Self-pay | Admitting: Gynecology

## 2011-05-04 NOTE — Telephone Encounter (Signed)
Error/disregard

## 2011-08-31 ENCOUNTER — Encounter: Payer: Self-pay | Admitting: Gynecology

## 2011-08-31 ENCOUNTER — Ambulatory Visit (INDEPENDENT_AMBULATORY_CARE_PROVIDER_SITE_OTHER): Payer: 59 | Admitting: Gynecology

## 2011-08-31 DIAGNOSIS — B9689 Other specified bacterial agents as the cause of diseases classified elsewhere: Secondary | ICD-10-CM

## 2011-08-31 DIAGNOSIS — R350 Frequency of micturition: Secondary | ICD-10-CM

## 2011-08-31 DIAGNOSIS — R300 Dysuria: Secondary | ICD-10-CM

## 2011-08-31 DIAGNOSIS — N898 Other specified noninflammatory disorders of vagina: Secondary | ICD-10-CM

## 2011-08-31 DIAGNOSIS — A499 Bacterial infection, unspecified: Secondary | ICD-10-CM

## 2011-08-31 DIAGNOSIS — N760 Acute vaginitis: Secondary | ICD-10-CM

## 2011-08-31 DIAGNOSIS — N899 Noninflammatory disorder of vagina, unspecified: Secondary | ICD-10-CM

## 2011-08-31 LAB — URINALYSIS W MICROSCOPIC + REFLEX CULTURE
Leukocytes, UA: NEGATIVE
Nitrite: NEGATIVE
Specific Gravity, Urine: 1.02 (ref 1.005–1.030)
pH: 7 (ref 5.0–8.0)

## 2011-08-31 LAB — WET PREP FOR TRICH, YEAST, CLUE
Clue Cells Wet Prep HPF POC: NONE SEEN
Trich, Wet Prep: NONE SEEN

## 2011-08-31 MED ORDER — METRONIDAZOLE 500 MG PO TABS: 500.0000 mg | ORAL_TABLET | Freq: Two times a day (BID) | ORAL | Status: AC

## 2011-08-31 NOTE — Progress Notes (Signed)
Patient presents with a one-week history of urinary frequency and some stinging when she urinates. No fever chills nausea vomiting low back pain or other constitutional symptoms.  Exam with Amy chaperone present. Spine straight no CVA tenderness. Abdomen soft nontender without masses guarding rebound organomegaly. Pelvic external BUS vagina normal. Slight white discharge noted wet prep done. Cervix normal. Uterus normal size midline mobile nontender. Adnexa without masses or tenderness.  Assessment and plan: UA is negative. Wet prep is consistent with BV. I suspect she is having a little vaginitis which is causing her end stream dysuria. We'll treat with Flagyl 500 twice a day x7 days. Alcohol avoidance reviewed. Follow up if symptoms persist or recur.

## 2011-08-31 NOTE — Patient Instructions (Signed)
Take antibiotics as prescribed. Follow up if symptoms persist or recur. 

## 2011-09-09 ENCOUNTER — Other Ambulatory Visit: Payer: Self-pay | Admitting: *Deleted

## 2011-09-09 MED ORDER — FLUCONAZOLE 150 MG PO TABS: 150.0000 mg | ORAL_TABLET | Freq: Once | ORAL | Status: AC

## 2011-11-18 ENCOUNTER — Other Ambulatory Visit: Payer: Self-pay | Admitting: Women's Health

## 2012-03-30 ENCOUNTER — Ambulatory Visit (INDEPENDENT_AMBULATORY_CARE_PROVIDER_SITE_OTHER): Payer: 59 | Admitting: Endocrinology

## 2012-03-30 ENCOUNTER — Encounter: Payer: Self-pay | Admitting: Endocrinology

## 2012-03-30 VITALS — BP 126/84 | HR 65 | Temp 98.0°F | Resp 16 | Ht 61.0 in | Wt 113.3 lb

## 2012-03-30 DIAGNOSIS — E7800 Pure hypercholesterolemia, unspecified: Secondary | ICD-10-CM

## 2012-03-30 DIAGNOSIS — Z119 Encounter for screening for infectious and parasitic diseases, unspecified: Secondary | ICD-10-CM

## 2012-03-30 DIAGNOSIS — D509 Iron deficiency anemia, unspecified: Secondary | ICD-10-CM

## 2012-03-30 DIAGNOSIS — Z0000 Encounter for general adult medical examination without abnormal findings: Secondary | ICD-10-CM

## 2012-03-30 DIAGNOSIS — K769 Liver disease, unspecified: Secondary | ICD-10-CM

## 2012-03-30 NOTE — Patient Instructions (Addendum)
Tomorrow, please go to the [facility] office, for blood tests, PPD skin test, vision test, and MMR vaccine. Please come back here on Monday, to read skin test, and to finish form. please consider these measures for your health:  minimize alcohol.  do not use tobacco products.  have a colonoscopy at least every 10 years from age 40.  Women should have an annual mammogram from age 45.  keep firearms safely stored.  always use seat belts.  have working smoke alarms in your home.  see an eye doctor and dentist regularly.  never drive under the influence of alcohol or drugs (including prescription drugs).

## 2012-03-30 NOTE — Progress Notes (Signed)
Subjective:    Patient ID: Stacy Dunn, female    DOB: 1971/05/27, 41 y.o.   MRN: [id number]  HPI here for regular wellness examination.  she's feeling pretty well in general, and says chronic med probs are stable, except as noted below Past Medical History  Diagnosis Date  . Other specified disease of hair and hair follicles   . Left sided ulcerative (chronic) colitis   . 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  History   Social History  . Marital Status: Single    Spouse Name: N/A    Number of Children: N/A  . Years of Education: N/A   Occupational History  . Not on file.   Social History Main Topics  . Smoking status: Never Smoker   . Smokeless tobacco: Never Used   Comment: Daily Caffeine - NO  . Alcohol Use: No  . Drug Use: No  . Sexually Active: Yes -- Female partner(s)    Birth Control/ Protection: Condom   Other Topics Concern  . Not on file   Social History Narrative  . No narrative on file    Current Outpatient Prescriptions on File Prior to Visit  Medication Sig Dispense Refill  . Calcium Carbonate-Vitamin D (CALCIUM PLUS VITAMIN D PO) Take 1 each by mouth daily.        Chilton Si Tea, Camillia sinensis, ([employer] GREEN TEA EXTRACT PO) Take 1 tablet by mouth daily.        Marland Kitchen LIALDA 1.2 G EC tablet TAKE 1 TABLET BY MOUTH EVERY DAY  30 tablet  0  . Milk Thistle 140 MG CAPS Take 1 capsule by mouth daily.        . Multiple Vitamin (MULTIVITAMIN) tablet Take 1 tablet by mouth daily.        . NON FORMULARY Tumeric  1 by mouth once daily       . NON FORMULARY as needed. Hyalomic Acid  1 by mouth once daily      . valACYclovir (VALTREX) 500 MG tablet TAKE 1 TABLET EVERY DAY AS NEEDED  30 tablet  3    Allergies  Allergen Reactions  . Doxycycline Hyclate     REACTION: unspecified  . Minocycline     Family History  Problem Relation Age of Onset  . Heart disease Brother     BYPASS AT AGE 79  . Heart block  Brother   . Diabetes Maternal Grandmother   . Hypertension Maternal Grandmother     BP 126/84  Pulse 65  Temp 98 F (36.7 C) (Oral)  Resp 16  Ht 5\' 1"  (1.549 m)  Wt 113 lb 5 oz (51.398 kg)  BMI 21.41 kg/m2  SpO2 97%  LMP 02/29/2012  Review of Systems  Constitutional: Negative for fever and unexpected weight change.  HENT: Negative for hearing loss.   Eyes: Negative for visual disturbance.  Respiratory: Negative for shortness of breath.   Cardiovascular: Negative for chest pain.  Gastrointestinal: Negative for anal bleeding.  Genitourinary: Negative for hematuria.  Musculoskeletal: Negative for back pain.  Skin: Negative for rash.  Neurological: Negative for syncope and numbness.  Hematological: Does not bruise/bleed easily.  Psychiatric/Behavioral: Negative for dysphoric mood.      Objective:   Physical Exam VS: see vs page GEN: no distress HEAD: head: no deformity eyes: no periorbital swelling, no proptosis external nose and ears are normal mouth: no lesion seen NECK: supple, thyroid  is not enlarged CHEST WALL: no deformity LUNGS:  Clear to auscultation BREASTS:  sees gyn CV: reg rate and rhythm, no murmur ABD: abdomen is soft, nontender.  no hepatosplenomegaly.  not distended.  no hernia GENITALIA/RECTAL: sees gyn MUSCULOSKELETAL: muscle bulk and strength are grossly normal.  no obvious joint swelling.  gait is normal and steady EXTEMITIES: no deformity.  no ulcer on the feet.  feet are of normal color and temp.  no edema PULSES: dorsalis pedis intact bilat.  no carotid bruit NEURO:  cn 2-12 grossly intact.   readily moves all 4's.  sensation is intact to touch on the feet SKIN:  Normal texture and temperature.  No rash or suspicious lesion is visible.   NODES:  None palpable at the neck PSYCH: alert, oriented x3.  Does not appear anxious nor depressed.     Assessment & Plan:  Wellness visit today, with problems stable, except as noted.

## 2012-03-31 ENCOUNTER — Encounter: Payer: 59 | Admitting: Endocrinology

## 2012-03-31 ENCOUNTER — Other Ambulatory Visit (INDEPENDENT_AMBULATORY_CARE_PROVIDER_SITE_OTHER): Payer: 59

## 2012-03-31 DIAGNOSIS — K769 Liver disease, unspecified: Secondary | ICD-10-CM

## 2012-03-31 DIAGNOSIS — E7800 Pure hypercholesterolemia, unspecified: Secondary | ICD-10-CM

## 2012-03-31 DIAGNOSIS — D509 Iron deficiency anemia, unspecified: Secondary | ICD-10-CM

## 2012-03-31 DIAGNOSIS — Z119 Encounter for screening for infectious and parasitic diseases, unspecified: Secondary | ICD-10-CM

## 2012-03-31 DIAGNOSIS — Z0000 Encounter for general adult medical examination without abnormal findings: Secondary | ICD-10-CM

## 2012-03-31 LAB — CBC WITH DIFFERENTIAL/PLATELET
Basophils Relative: 0.4 % (ref 0.0–3.0)
Eosinophils Absolute: 0.1 10*3/uL (ref 0.0–0.7)
Hemoglobin: 11.2 g/dL — ABNORMAL LOW (ref 12.0–15.0)
MCHC: 32.4 g/dL (ref 30.0–36.0)
MCV: 95.8 fl (ref 78.0–100.0)
Monocytes Absolute: 0.6 10*3/uL (ref 0.1–1.0)
Neutro Abs: 6.3 10*3/uL (ref 1.4–7.7)
RBC: 3.61 Mil/uL — ABNORMAL LOW (ref 3.87–5.11)

## 2012-03-31 LAB — URINALYSIS, ROUTINE W REFLEX MICROSCOPIC
Specific Gravity, Urine: >=1.03 (ref 1.000–1.030)
Urine Glucose: NEGATIVE
Urobilinogen, UA: 0.2 (ref 0.0–1.0)

## 2012-03-31 LAB — HEPATIC FUNCTION PANEL
ALT: 15 U/L (ref 0–35)
Albumin: 3.8 g/dL (ref 3.5–5.2)
Alkaline Phosphatase: 67 U/L (ref 39–117)
Bilirubin, Direct: 0 mg/dL (ref 0.0–0.3)
Total Protein: 7.5 g/dL (ref 6.0–8.3)

## 2012-03-31 LAB — BASIC METABOLIC PANEL
BUN: 8 mg/dL (ref 6–23)
CO2: 24 mEq/L (ref 19–32)
Chloride: 108 mEq/L (ref 96–112)
Creatinine, Ser: 0.9 mg/dL (ref 0.4–1.2)
Glucose, Bld: 112 mg/dL — ABNORMAL HIGH (ref 70–99)

## 2012-03-31 LAB — LIPID PANEL
Cholesterol: 181 mg/dL (ref 0–200)
LDL Cholesterol: 105 mg/dL — ABNORMAL HIGH (ref 0–99)
Triglycerides: 66 mg/dL (ref 0.0–149.0)

## 2012-03-31 LAB — TSH: TSH: 0.78 u[IU]/mL (ref 0.35–5.50)

## 2012-04-03 ENCOUNTER — Ambulatory Visit (INDEPENDENT_AMBULATORY_CARE_PROVIDER_SITE_OTHER): Payer: 59

## 2012-04-03 ENCOUNTER — Telehealth: Payer: Self-pay

## 2012-04-03 DIAGNOSIS — Z23 Encounter for immunization: Secondary | ICD-10-CM

## 2012-04-03 DIAGNOSIS — Z111 Encounter for screening for respiratory tuberculosis: Secondary | ICD-10-CM

## 2012-04-03 NOTE — Telephone Encounter (Signed)
Pt wants to schedule a Hep B vaccine at [facility], but she said they would not schedule without you calling first.

## 2012-04-03 NOTE — Telephone Encounter (Signed)
Appt made for Wednesday 10/9 for Hep B inj. Please see Lab note attached to labs completed on 03/31/12. Thanks.

## 2012-04-04 ENCOUNTER — Ambulatory Visit: Payer: 59

## 2012-04-05 ENCOUNTER — Encounter: Payer: Self-pay | Admitting: *Deleted

## 2012-04-05 ENCOUNTER — Ambulatory Visit (INDEPENDENT_AMBULATORY_CARE_PROVIDER_SITE_OTHER): Payer: 59 | Admitting: *Deleted

## 2012-04-05 DIAGNOSIS — Z23 Encounter for immunization: Secondary | ICD-10-CM

## 2012-04-06 ENCOUNTER — Telehealth: Payer: Self-pay | Admitting: Endocrinology

## 2012-04-06 NOTE — Telephone Encounter (Signed)
please call patient: This vision test is to old.  Please do at [facility]

## 2012-04-10 ENCOUNTER — Other Ambulatory Visit: Payer: Self-pay | Admitting: Endocrinology

## 2012-04-10 ENCOUNTER — Ambulatory Visit: Payer: 59

## 2012-04-10 NOTE — Telephone Encounter (Signed)
Pt has a current eye exam, it was attached to paperwork.

## 2012-04-11 LAB — VARICELLA ZOSTER ANTIBODY, IGG: Varicella IgG: 4.16 {ISR} — ABNORMAL HIGH (ref <0.90)

## 2012-04-12 ENCOUNTER — Ambulatory Visit: Payer: 59 | Admitting: Gastroenterology

## 2012-04-12 ENCOUNTER — Ambulatory Visit: Payer: 59

## 2012-05-03 ENCOUNTER — Encounter: Payer: 59 | Admitting: Women's Health

## 2012-05-05 ENCOUNTER — Ambulatory Visit (INDEPENDENT_AMBULATORY_CARE_PROVIDER_SITE_OTHER): Payer: 59 | Admitting: *Deleted

## 2012-05-05 DIAGNOSIS — Z23 Encounter for immunization: Secondary | ICD-10-CM

## 2012-05-10 ENCOUNTER — Encounter: Payer: Self-pay | Admitting: Women's Health

## 2012-05-10 ENCOUNTER — Ambulatory Visit (INDEPENDENT_AMBULATORY_CARE_PROVIDER_SITE_OTHER): Payer: 59 | Admitting: Women's Health

## 2012-05-10 DIAGNOSIS — R300 Dysuria: Secondary | ICD-10-CM

## 2012-05-10 DIAGNOSIS — N390 Urinary tract infection, site not specified: Secondary | ICD-10-CM

## 2012-05-10 LAB — URINALYSIS W MICROSCOPIC + REFLEX CULTURE
Protein, ur: NEGATIVE mg/dL
Urobilinogen, UA: 0.2 mg/dL (ref 0.0–1.0)

## 2012-05-10 MED ORDER — SULFAMETHOXAZOLE-TRIMETHOPRIM 800-160 MG PO TABS: 1.0000 | ORAL_TABLET | Freq: Two times a day (BID) | ORAL | Status: DC

## 2012-05-10 NOTE — Progress Notes (Signed)
Patient ID: Stacy Dunn, female   DOB: [birth year], 41 y.o.   MRN: [id number] Presents with the complaint of increased urinary frequency, urgency and pain at end of stream for several days. Same partner. Denies a fever or vaginal discharge.  Exam: UA: Moderate blood, small leukocytes, 11-20 rbc's in 7-10 WBCs and many bacteria. No CVAT.  UTI  Plan: Septra DS one by mouth twice a day for 3 days. UTI prevention discussed, will check a test of cure UA at scheduled annual exam next week. Instructed to call if no relief of symptoms.

## 2012-05-10 NOTE — Patient Instructions (Addendum)
Urinary Tract Infection Urinary tract infections (UTIs) can develop anywhere along your urinary tract. Your urinary tract is your body's drainage system for removing wastes and extra water. Your urinary tract includes two kidneys, two ureters, a bladder, and a urethra. Your kidneys are a pair of bean-shaped organs. Each kidney is about the size of your fist. They are located below your ribs, one on each side of your spine. CAUSES Infections are caused by microbes, which are microscopic organisms, including fungi, viruses, and bacteria. These organisms are so small that they can only be seen through a microscope. Bacteria are the microbes that most commonly cause UTIs. SYMPTOMS  Symptoms of UTIs may vary by age and gender of the patient and by the location of the infection. Symptoms in young women typically include a frequent and intense urge to urinate and a painful, burning feeling in the bladder or urethra during urination. Older women and men are more likely to be tired, shaky, and weak and have muscle aches and abdominal pain. A fever may mean the infection is in your kidneys. Other symptoms of a kidney infection include pain in your back or sides below the ribs, nausea, and vomiting. DIAGNOSIS To diagnose a UTI, your caregiver will ask you about your symptoms. Your caregiver also will ask to provide a urine sample. The urine sample will be tested for bacteria and white blood cells. White blood cells are made by your body to help fight infection. TREATMENT  Typically, UTIs can be treated with medication. Because most UTIs are caused by a bacterial infection, they usually can be treated with the use of antibiotics. The choice of antibiotic and length of treatment depend on your symptoms and the type of bacteria causing your infection. HOME CARE INSTRUCTIONS  If you were prescribed antibiotics, take them exactly as your caregiver instructs you. Finish the medication even if you feel better after you  have only taken some of the medication.  Drink enough water and fluids to keep your urine clear or pale yellow.  Avoid caffeine, tea, and carbonated beverages. They tend to irritate your bladder.  Empty your bladder often. Avoid holding urine for long periods of time.  Empty your bladder before and after sexual intercourse.  After a bowel movement, women should cleanse from front to back. Use each tissue only once. SEEK MEDICAL CARE IF:   You have back pain.  You develop a fever.  Your symptoms do not begin to resolve within 3 days. SEEK IMMEDIATE MEDICAL CARE IF:   You have severe back pain or lower abdominal pain.  You develop chills.  You have nausea or vomiting.  You have continued burning or discomfort with urination. MAKE SURE YOU:   Understand these instructions.  Will watch your condition.  Will get help right away if you are not doing well or get worse. Document Released: 03/24/2005 Document Revised: 12/14/2011 Document Reviewed: 07/23/2011 [employer] Patient Information 2013 [employer].  

## 2012-05-11 ENCOUNTER — Other Ambulatory Visit: Payer: Self-pay | Admitting: Women's Health

## 2012-05-11 DIAGNOSIS — Z1231 Encounter for screening mammogram for malignant neoplasm of breast: Secondary | ICD-10-CM

## 2012-05-13 LAB — URINE CULTURE

## 2012-05-15 ENCOUNTER — Telehealth: Payer: Self-pay | Admitting: Endocrinology

## 2012-05-15 NOTE — Telephone Encounter (Signed)
Forward 2 pages from [facility] to Dr. Romero Belling for review on 05-15-12 ym

## 2012-05-16 ENCOUNTER — Telehealth: Payer: Self-pay | Admitting: Endocrinology

## 2012-05-16 NOTE — Telephone Encounter (Signed)
Forward 2 pages from [facility] to Dr. Romero Belling for review on 07-17-11 ym

## 2012-05-19 ENCOUNTER — Encounter: Payer: Self-pay | Admitting: Women's Health

## 2012-05-19 ENCOUNTER — Ambulatory Visit (INDEPENDENT_AMBULATORY_CARE_PROVIDER_SITE_OTHER): Payer: 59 | Admitting: Women's Health

## 2012-05-19 ENCOUNTER — Encounter: Payer: Self-pay | Admitting: Gynecology

## 2012-05-19 VITALS — BP 116/76 | Ht 61.5 in | Wt 111.0 lb

## 2012-05-19 DIAGNOSIS — Z01419 Encounter for gynecological examination (general) (routine) without abnormal findings: Secondary | ICD-10-CM

## 2012-05-19 DIAGNOSIS — B009 Herpesviral infection, unspecified: Secondary | ICD-10-CM

## 2012-05-19 MED ORDER — VALACYCLOVIR HCL 500 MG PO TABS: ORAL_TABLET | ORAL | Status: DC

## 2012-05-19 NOTE — Progress Notes (Signed)
Stacy Dunn 01/01/1971 [id number]    History:    The patient presents for annual exam.  Regular monthly cycle/condoms. History of ascus 2001 after normal Paps. Mammogram scheduled for December. Had labs at primary care was found to be anemic and is currently on ron supplement. Long-term history of ulcerative colitis has had better symptom relief after stopping soy products in diet.   Past medical history, past surgical history, family history and social history were all reviewed and documented in the EPIC chart. Works at [employer]. Aaron16 doing well. Grandmother lives with patient. Brother cardiac bypass surgery at age 78, father died  MVA.   ROS:  A  ROS was performed and pertinent positives and negatives are included in the history.  Exam:  Filed Vitals:   05/19/12 0942  BP: 116/76    General appearance:  Normal Head/Neck:  Normal, without cervical or supraclavicular adenopathy. Thyroid:  Symmetrical, normal in size, without palpable masses or nodularity. Respiratory  Effort:  Normal  Auscultation:  Clear without wheezing or rhonchi Cardiovascular  Auscultation:  Regular rate, without rubs, murmurs or gallops  Edema/varicosities:  Not grossly evident Abdominal  Soft,nontender, without masses, guarding or rebound.  Liver/spleen:  No organomegaly noted  Hernia:  None appreciated  Skin  Inspection:  Grossly normal  Palpation:  Grossly normal Neurologic/psychiatric  Orientation:  Normal with appropriate conversation.  Mood/affect:  Normal  Genitourinary    Breasts: Examined lying and sitting.     Right: Without masses, retractions, discharge or axillary adenopathy.     Left: Without masses, retractions, discharge or axillary adenopathy.   Inguinal/mons:  Normal without inguinal adenopathy  External genitalia:  Normal  BUS/Urethra/Skene's glands:  Normal  Bladder:  Normal  Vagina:  Normal  Cervix:  Normal  Uterus:   normal in size, shape and contour.   Midline and mobile  Adnexa/parametria:     Rt: Without masses or tenderness.   Lt: Without masses or tenderness.  Anus and perineum: Normal  Digital rectal exam: Normal sphincter tone without palpated masses or tenderness  Assessment/Plan:  41 y.o. DPF G2 P1 for annual exam.    Normal GYN exam/condoms HSV history rare outbreaks Ulcerative colitis Iron deficiency anemia-primary care  Plan: Contraception options reviewed declined will continue with condoms. Plan B emergency contraception reviewed. SBE's, keep scheduled mammogram appointment, continue exercise, iron rich diet and iron supplements encouraged. UA: Pap normal 2012, reviewed new screening guidelines.     Harrington Challenger [facility], 10:49 AM 05/19/2012

## 2012-05-19 NOTE — Patient Instructions (Addendum)

## 2012-06-05 ENCOUNTER — Ambulatory Visit
Admission: RE | Admit: 2012-06-05 | Discharge: 2012-06-05 | Disposition: A | Discharge status: Home / Self Care | Payer: 59 | Source: Ambulatory Visit | Attending: Women's Health | Admitting: Women's Health

## 2012-06-05 DIAGNOSIS — Z1231 Encounter for screening mammogram for malignant neoplasm of breast: Secondary | ICD-10-CM

## 2012-11-02 ENCOUNTER — Ambulatory Visit: Payer: 59 | Admitting: Endocrinology

## 2012-11-03 ENCOUNTER — Ambulatory Visit (INDEPENDENT_AMBULATORY_CARE_PROVIDER_SITE_OTHER): Payer: 59

## 2012-11-03 ENCOUNTER — Ambulatory Visit: Payer: 59

## 2012-11-03 DIAGNOSIS — Z23 Encounter for immunization: Secondary | ICD-10-CM

## 2013-01-02 ENCOUNTER — Other Ambulatory Visit: Payer: Self-pay | Admitting: Women's Health

## 2013-01-25 ENCOUNTER — Encounter: Payer: Self-pay | Admitting: Internal Medicine

## 2013-01-25 ENCOUNTER — Ambulatory Visit (INDEPENDENT_AMBULATORY_CARE_PROVIDER_SITE_OTHER): Payer: 59 | Admitting: Internal Medicine

## 2013-01-25 VITALS — BP 112/72 | HR 79 | Temp 98.5°F | Wt 116.0 lb

## 2013-01-25 DIAGNOSIS — N750 Cyst of Bartholin's gland: Secondary | ICD-10-CM

## 2013-01-25 DIAGNOSIS — J312 Chronic pharyngitis: Secondary | ICD-10-CM

## 2013-01-25 NOTE — Progress Notes (Signed)
Subjective:    Patient ID: Stacy Dunn, female    DOB: 1970-08-20, 42 y.o.   MRN: [id number]  HPI  Pt presents to the clinic today with c/o sore throat. This started about 1 year ago. This has been intermittent. She usually takes an herbal tea which seems to help relieve her sore throat. The pain is worse when she swallows. She denies fever, chills or body aches. She does have a history of tonsillar stones. She has been evaluated by ENT for this. Most of the time, she can get them out on her own. Additionally, she c/o a lump on her right labia. She noticed this about 1 week ago. It is only painful when she touches it. She has not put anything on it. 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 She reports that this is not a herpes outbreak. She does have a vaginal discharge. She denies abnormal bleeding, vaginal pain, or odorous discharge.  Review of Systems  Past Medical History  Diagnosis Date  . Other specified disease of hair and hair follicles   . Left sided ulcerative (chronic) colitis   . 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 Calcium Carbonate-Vitamin D (CALCIUM PLUS VITAMIN D PO) Take 1 each by mouth daily.        . IRON PO Take by mouth.      . Multiple Vitamin (MULTIVITAMIN) tablet Take 1 tablet by mouth daily.        . valACYclovir (VALTREX) 500 MG tablet TAKE 1 TABLET EVERY DAY AS NEEDED  30 tablet  3   No current facility-administered medications for this visit.    Allergies  Allergen Reactions  . Doxycycline Hyclate     REACTION: unspecified  . Minocycline     Family History  Problem Relation Age of Onset  . Heart disease Brother     BYPASS AT AGE 30  . Heart block Brother   . Diabetes Maternal Grandmother   . Hypertension Maternal Grandmother     History   Social History  . Marital Status: Single    Spouse Name: N/A    Number of Children: N/A  . Years of Education: N/A    Occupational History  . Not on file.   Social History Main Topics  . Smoking status: Never Smoker   . Smokeless tobacco: Never Used     Comment: Daily Caffeine - NO  . Alcohol Use: No  . Drug Use: No  . Sexually Active: Yes -- Female partner(s)    Birth Control/ Protection: Condom   Other Topics Concern  . Not on file   Social History Narrative  . No narrative on file     Constitutional: Denies fever, malaise, fatigue, headache or abrupt weight changes.  HEENT: Pt reports sore throat. Denies eye pain, eye redness, ear pain, ringing in the ears, wax buildup, runny nose, nasal congestion, bloody nose.  GU: Denies urgency, frequency, pain with urination, burning sensation, blood in urine, odor or discharge. Skin: Pt reports lump on right labia. Denies redness, rashes, lesions or ulcercations.     No other specific complaints in a complete review of systems (except as listed in HPI above).     Objective:   Physical Exam   BP 112/72  Pulse 79  Temp(Src) 98.5 F (36.9 C) (Oral)  Wt 116 lb (52.617 kg)  BMI 21.57 kg/m2  SpO2 97% Wt Readings  from Last 3 Encounters:  01/25/13 116 lb (52.617 kg)  05/19/12 111 lb (50.349 kg)  03/30/12 113 lb 5 oz (51.398 kg)    General: Appears her stated age, well developed, well nourished in NAD. Skin: Warm, dry and intact. No rashes, lesions or ulcerations noted. Cyst noted of barthoins gland right labia. HEENT: Head: normal shape and size; Eyes: sclera white, no icterus, conjunctiva pink, PERRLA and EOMs intact; Ears: Tm's gray and intact, normal light reflex; Nose: mucosa pink and moist, septum midline; Throat/Mouth: Teeth present, mucosa pink and moist, no exudate, lesions or ulcerations noted.  Cardiovascular: Normal rate and rhythm. S1,S2 noted.  No murmur, rubs or gallops noted. No JVD or BLE edema. No carotid bruits noted. Pulmonary/Chest: Normal effort and positive vesicular breath sounds. No respiratory distress. No wheezes, rales  or ronchi noted.    BMET    Component Value Date/Time   NA 137 03/31/2012 1644   K 3.7 03/31/2012 1644   CL 108 03/31/2012 1644   CO2 24 03/31/2012 1644   GLUCOSE 112* 03/31/2012 1644   BUN 8 03/31/2012 1644   CREATININE 0.9 03/31/2012 1644   CALCIUM 8.5 03/31/2012 1644   GFRNONAA 114.84 01/15/2010 1642    Lipid Panel     Component Value Date/Time   CHOL 181 03/31/2012 1644   TRIG 66.0 03/31/2012 1644   HDL 62.90 03/31/2012 1644   CHOLHDL 3 03/31/2012 1644   VLDL 13.2 03/31/2012 1644   LDLCALC 105* 03/31/2012 1644    CBC    Component Value Date/Time   WBC 7.8 03/31/2012 1644   RBC 3.61* 03/31/2012 1644   HGB 11.2* 03/31/2012 1644   HCT 34.6* 03/31/2012 1644   PLT 240.0 03/31/2012 1644   MCV 95.8 03/31/2012 1644   MCHC 32.4 03/31/2012 1644   RDW 13.4 03/31/2012 1644   LYMPHSABS 0.8 03/31/2012 1644   MONOABS 0.6 03/31/2012 1644   EOSABS 0.1 03/31/2012 1644   BASOSABS 0.0 03/31/2012 1644    Hgb A1C No results found for this basename: HGBA1C         Assessment & Plan:   Pharyngitis, chronic:  Pt declined RST Pt declined to be tested for mono Pt declined 80 mg Depo IM Continue drinking the herbal tea  Cyst of bartholin's gland, right labia:  Not infected at this time Place hot compresses on it TID to encourage it to drain Make a follow up with your gyn to have it drained  RTC as needed or if symptoms persist or worsen

## 2013-01-25 NOTE — Patient Instructions (Addendum)
Bartholin's Cyst or Abscess Bartholin's glands are small glands located within the folds of skin (labia) along the sides of the lower opening of the vagina (birth canal). A cyst may develop when the duct of the gland becomes blocked. When this happens, fluid that accumulates within the cyst can become infected. This is known as an abscess. The Bartholin gland produces a mucous fluid to lubricate the outside of the vagina during sexual intercourse. SYMPTOMS   Patients with a small cyst may not have any symptoms.  Mild discomfort to severe pain depending on the size of the cyst and if it is infected (abscess).  Pain, redness, and swelling around the lower opening of the vagina.  Painful intercourse.  Pressure in the perineal area.  Swelling of the lips of the vagina (labia).  The cyst or abscess can be on one side or both sides of the vagina. DIAGNOSIS   A large swelling is seen in the lower vagina area by your caregiver.  Painful to touch.  Redness and pain, if it is an abscess. TREATMENT   Sometimes the cyst will go away on its own.  Apply warm wet compresses to the area or take hot sitz baths several times a day.  An incision to drain the cyst or abscess with local anesthesia.  Culture the pus, if it is an abscess.  Antibiotic treatment, if it is an abscess.  Cut open the gland and suture the edges to make the opening of the gland bigger (marsupialization).  Remove the whole gland if the cyst or abscess returns. PREVENTION   Practice good hygiene.  Clean the vaginal area with a mild soap and soft cloth when bathing.  Do not rub hard in the vaginal area when bathing.  Protect the crotch area with a padded cushion if you take long bike rides or ride horses.  Be sure you are well lubricated when you have sexual intercourse. HOME CARE INSTRUCTIONS   If your cyst or abscess was opened, a small piece of gauze, or a drain, may have been placed in the wound to allow  drainage. Do not remove this gauze or drain unless directed by your caregiver.  Wear feminine pads, not tampons, as needed for any drainage or bleeding.  If antibiotics were prescribed, take them exactly as directed. Finish the entire course.  Only take over-the-counter or prescription medicines for pain, discomfort, or fever as directed by your caregiver. SEEK IMMEDIATE MEDICAL CARE IF:   You have an increase in pain, redness, swelling, or drainage.  You have bleeding from the wound which results in the use of more than the number of pads suggested by your caregiver in 24 hours.  You have chills.  You have a fever.  You develop any new problems (symptoms) or aggravation of your existing condition. MAKE SURE YOU:   Understand these instructions.  Will watch your condition.  Will get help right away if you are not doing well or get worse. Document Released: 06/14/2005 Document Revised: 09/06/2011 Document Reviewed: 01/31/2008 [facility] Patient Information 2014 [place], [place]. Infectious Mononucleosis Infectious mononucleosis (mono) is a common germ (viral) infection in children, teenagers, and young adults.  CAUSES  Mono is an infection caused by the Malachi Carl virus. The virus is spread by close personal contact with someone who has the infection. It can be passed by contact with your saliva through things such as kissing or sharing drinking glasses. Sometimes, the infection can be spread from someone who does not appear sick  but still spreads the virus (asymptomatic carrier state).  SYMPTOMS  The most common symptoms of Mono are:  Sore throat.  Headache.  Fatigue.  Muscle aches.  Swollen glands.  Fever.  Poor appetite.  Enlarged liver or spleen. The less common symptoms can include:  Rash.  Feeling sick to your stomach (nauseous).  Abdominal pain. DIAGNOSIS  Mono is diagnosed by a blood test.  TREATMENT  Treatment of mono is usually at home. There is no  medicine that cures this virus. Sometimes hospital treatment is needed in severe cases. Steroid medicine sometimes is needed if the swelling in the throat causes breathing or swallowing problems.  HOME CARE INSTRUCTIONS   Drink enough fluids to keep your urine clear or pale yellow.  Eat soft foods. Cool foods like popsicles or ice cream can soothe a sore throat.  Only take over-the-counter or prescription medicines for pain, discomfort, or fever as directed by your caregiver. Children under 18 years of age should not take aspirin.  Gargle salt water. This may help relieve your sore throat. Put 1 teaspoon (tsp) of salt in 1 cup of warm water. Sucking on hard candy may also help.  Rest as needed.  Start regular activities gradually after the fever is gone. Be sure to rest when tired.  Avoid strenuous exercise or contact sports until your caregiver says it is okay. The liver and spleen could be seriously injured.  Avoid sharing drinking glasses or kissing until your caregiver tells you that you are no longer contagious. SEEK MEDICAL CARE IF:   Your fever is not gone after 7 days.  Your activity level is not back to normal after 2 weeks.  You have yellow coloring to eyes and skin (jaundice). SEEK IMMEDIATE MEDICAL CARE IF:   You have severe pain in the abdomen or shoulder.  You have trouble swallowing or drooling.  You have trouble breathing.  You develop a stiff neck.  You develop a severe headache.  You cannot stop throwing up (vomiting).  You have convulsions.  You are confused.  You have trouble with balance.  You develop signs of body fluid loss (dehydration):  Weakness.  Sunken eyes.  Pale skin.  Dry mouth.  Rapid breathing or pulse. MAKE SURE YOU:   Understand these instructions.  Will watch your condition.  Will get help right away if you are not doing well or get worse. Document Released: 06/11/2000 Document Revised: 09/06/2011 Document Reviewed:  04/09/2008 [facility] Patient Information 2014 [place], [place].

## 2013-03-01 ENCOUNTER — Ambulatory Visit: Payer: Self-pay | Admitting: Women's Health

## 2013-05-03 ENCOUNTER — Other Ambulatory Visit: Payer: Self-pay

## 2013-05-11 ENCOUNTER — Other Ambulatory Visit: Payer: Self-pay

## 2013-05-11 DIAGNOSIS — Z1231 Encounter for screening mammogram for malignant neoplasm of breast: Secondary | ICD-10-CM

## 2013-05-14 ENCOUNTER — Telehealth: Payer: Self-pay | Admitting: Gastroenterology

## 2013-05-14 ENCOUNTER — Encounter: Payer: Self-pay | Admitting: Gastroenterology

## 2013-05-14 NOTE — Telephone Encounter (Signed)
Patient needs to be seen in the office

## 2013-06-13 ENCOUNTER — Encounter: Payer: Self-pay | Admitting: Gastroenterology

## 2013-06-13 ENCOUNTER — Ambulatory Visit (INDEPENDENT_AMBULATORY_CARE_PROVIDER_SITE_OTHER): Payer: 59 | Admitting: Gastroenterology

## 2013-06-13 ENCOUNTER — Ambulatory Visit: Payer: 59 | Admitting: Gastroenterology

## 2013-06-13 ENCOUNTER — Other Ambulatory Visit (INDEPENDENT_AMBULATORY_CARE_PROVIDER_SITE_OTHER): Payer: 59

## 2013-06-13 VITALS — BP 100/60 | HR 80 | Ht 61.0 in | Wt 117.1 lb

## 2013-06-13 DIAGNOSIS — K529 Noninfective gastroenteritis and colitis, unspecified: Secondary | ICD-10-CM

## 2013-06-13 DIAGNOSIS — K5150 Left sided colitis without complications: Secondary | ICD-10-CM

## 2013-06-13 DIAGNOSIS — K5289 Other specified noninfective gastroenteritis and colitis: Secondary | ICD-10-CM

## 2013-06-13 LAB — CBC WITH DIFFERENTIAL/PLATELET
Basophils Absolute: 0 10*3/uL (ref 0.0–0.1)
Basophils Relative: 0.6 % (ref 0.0–3.0)
Eosinophils Absolute: 0.2 10*3/uL (ref 0.0–0.7)
Eosinophils Relative: 3.6 % (ref 0.0–5.0)
Hemoglobin: 11.1 g/dL — ABNORMAL LOW (ref 12.0–15.0)
Lymphocytes Relative: 29.7 % (ref 12.0–46.0)
MCHC: 33.5 g/dL (ref 30.0–36.0)
MCV: 92.2 fl (ref 78.0–100.0)
Monocytes Absolute: 0.5 10*3/uL (ref 0.1–1.0)
Neutro Abs: 3.4 10*3/uL (ref 1.4–7.7)
RBC: 3.6 Mil/uL — ABNORMAL LOW (ref 3.87–5.11)
WBC: 5.9 10*3/uL (ref 4.5–10.5)

## 2013-06-13 LAB — COMPREHENSIVE METABOLIC PANEL
AST: 16 U/L (ref 0–37)
Albumin: 4 g/dL (ref 3.5–5.2)
Alkaline Phosphatase: 64 U/L (ref 39–117)
BUN: 11 mg/dL (ref 6–23)
Calcium: 8.6 mg/dL (ref 8.4–10.5)
Chloride: 107 mEq/L (ref 96–112)
Creatinine, Ser: 0.7 mg/dL (ref 0.4–1.2)
Glucose, Bld: 85 mg/dL (ref 70–99)
Total Protein: 7.8 g/dL (ref 6.0–8.3)

## 2013-06-13 LAB — HEPATITIS A ANTIBODY, IGM: Hep A IgM: NONREACTIVE

## 2013-06-13 LAB — BASIC METABOLIC PANEL
CO2: 24 mEq/L (ref 19–32)
Calcium: 8.6 mg/dL (ref 8.4–10.5)
Creatinine, Ser: 0.7 mg/dL (ref 0.4–1.2)
Glucose, Bld: 85 mg/dL (ref 70–99)
Sodium: 137 mEq/L (ref 135–145)

## 2013-06-13 LAB — HEPATITIS B SURFACE ANTIGEN: Hepatitis B Surface Ag: NEGATIVE

## 2013-06-13 LAB — HEPATITIS C ANTIBODY: HCV Ab: NEGATIVE

## 2013-06-13 MED ORDER — MESALAMINE 1.2 G PO TBEC: 2.4000 g | DELAYED_RELEASE_TABLET | Freq: Every day | ORAL | Status: AC

## 2013-06-13 NOTE — Progress Notes (Signed)
_                                                                                                                History of Present Illness: 42 year old Afro-American female with history of left-sided colitis here for evaluation of rectal bleeding.  For the past 2 months she has developed rectal bleeding consisting of bright red blood multiple times a day.  Stools are thin.  She has a tendency to be constipated, even during a flare.  He's had some abdominal pain only during the bowel movement.  While she had about 10 bowel movements a day initially, mostly of blood, she's now having just 2 bowel movements daily passing smaller amounts of blood.  She denies weakness or dizziness.  She is on no medications.    Past Medical History  Diagnosis Date  . Other specified disease of hair and hair follicles   . Left sided ulcerative (chronic) colitis   . 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 No pertinent past surgical history. family history includes Diabetes in her maternal grandmother; Heart block in her brother; Heart disease in her brother; Hypertension in her maternal grandmother. Current Outpatient Prescriptions  Medication Sig Dispense Refill  . Calcium Carbonate-Vitamin D (CALCIUM PLUS VITAMIN D PO) Take 1 each by mouth daily.        . IRON PO Take by mouth.      . Multiple Vitamin (MULTIVITAMIN) tablet Take 1 tablet by mouth daily.        . valACYclovir (VALTREX) 500 MG tablet TAKE 1 TABLET EVERY DAY AS NEEDED  30 tablet  3   No current facility-administered medications for this visit.   Allergies as of 06/13/2013 - Review Complete 06/13/2013  Allergen Reaction Noted  . Doxycycline hyclate  09/29/2006  . Minocycline      reports that she has never smoked. She has never used smokeless tobacco. She reports that she does not drink alcohol or use illicit drugs.     Review of Systems: Pertinent positive and negative review of systems  were noted in the above HPI section. All other review of systems were otherwise negative.  Vital signs were reviewed in today's medical record Physical Exam: General: Well developed , well nourished, no acute distress Skin: anicteric Head: Normocephalic and atraumatic Eyes:  sclerae anicteric, EOMI Ears: Normal auditory acuity Mouth: No deformity or lesions Neck: Supple, no masses or thyromegaly Lungs: Clear throughout to auscultation Heart: Regular rate and rhythm; no murmurs, rubs or bruits Abdomen: Soft, non tender and non distended. No masses, hepatosplenomegaly or hernias noted. Normal Bowel sounds Rectal:deferred Musculoskeletal: Symmetrical with no gross deformities  Skin: No lesions on visible extremities Pulses:  Normal pulses noted Extremities: No clubbing, cyanosis, edema or deformities noted Neurological: Alert oriented x 4, grossly nonfocal Cervical Nodes:  No significant cervical adenopathy Inguinal Nodes: No significant inguinal adenopathy Psychological:  Alert and cooperative. Normal mood and affect  See Assessment and Plan under Problem List

## 2013-06-13 NOTE — Patient Instructions (Signed)
Go to the basement for labs Follow up in 3 months In one month (BMET)

## 2013-06-13 NOTE — Addendum Note (Signed)
Addended by: Marlowe Kays on: 06/13/2013 02:38 PM   Modules accepted: Orders

## 2013-06-13 NOTE — Assessment & Plan Note (Addendum)
Patient has had a recent flare of her left-sided colitis.  Symptoms are improving spontaneously.  I explained to her that she could benefit from maintenance medicine with mesalamine since this will hopefully reduce the frequency of flareups and, by reducing inflammation, lower the risk for colon cancer.  Recommendations #1 resume lialda 2.4 g daily #2 check CBC and CMET today, BMET in one month 3.  check serologies for hepatitis A., B., C., and PPD

## 2013-06-15 ENCOUNTER — Encounter: Payer: Self-pay | Admitting: Women's Health

## 2013-06-15 ENCOUNTER — Ambulatory Visit
Admission: RE | Admit: 2013-06-15 | Discharge: 2013-06-15 | Disposition: A | Discharge status: Home / Self Care | Payer: 59 | Source: Ambulatory Visit

## 2013-06-15 ENCOUNTER — Other Ambulatory Visit (HOSPITAL_COMMUNITY)
Admission: RE | Admit: 2013-06-15 | Discharge: 2013-06-15 | Disposition: A | Discharge status: Home / Self Care | Payer: 59 | Source: Ambulatory Visit | Attending: Gynecology | Admitting: Gynecology

## 2013-06-15 ENCOUNTER — Ambulatory Visit (INDEPENDENT_AMBULATORY_CARE_PROVIDER_SITE_OTHER): Payer: 59 | Admitting: Women's Health

## 2013-06-15 VITALS — BP 108/62 | Ht 61.75 in | Wt 114.8 lb

## 2013-06-15 DIAGNOSIS — Z1231 Encounter for screening mammogram for malignant neoplasm of breast: Secondary | ICD-10-CM

## 2013-06-15 DIAGNOSIS — A499 Bacterial infection, unspecified: Secondary | ICD-10-CM

## 2013-06-15 DIAGNOSIS — N898 Other specified noninflammatory disorders of vagina: Secondary | ICD-10-CM

## 2013-06-15 DIAGNOSIS — Z01419 Encounter for gynecological examination (general) (routine) without abnormal findings: Secondary | ICD-10-CM

## 2013-06-15 DIAGNOSIS — B009 Herpesviral infection, unspecified: Secondary | ICD-10-CM

## 2013-06-15 DIAGNOSIS — B9689 Other specified bacterial agents as the cause of diseases classified elsewhere: Secondary | ICD-10-CM

## 2013-06-15 DIAGNOSIS — N760 Acute vaginitis: Secondary | ICD-10-CM

## 2013-06-15 LAB — WET PREP FOR TRICH, YEAST, CLUE

## 2013-06-15 MED ORDER — VALACYCLOVIR HCL 500 MG PO TABS: ORAL_TABLET | ORAL | Status: AC

## 2013-06-15 MED ORDER — METRONIDAZOLE 0.75 % VA GEL: VAGINAL | Status: AC

## 2013-06-15 NOTE — Progress Notes (Signed)
Stacy Dunn 1970/08/25 [id number]    History:    The patient presents for annual exam.  Monthly cycles/condoms. 2001 ascus with normal Paps after. Normal mammogram history. HSV, rare outbreaks   Past medical history, past surgical history, family history and social history were all reviewed and documented in the EPIC chart. Works at [employer]. Ulcerative colitis.  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 Brother cardiac bypass age 41.   ROS:  A  ROS was performed and pertinent positives and negatives are included in the history.  Exam:  Filed Vitals:   06/15/13 1414  BP: 108/62    General appearance:  Normal Head/Neck:  Normal, without cervical or supraclavicular adenopathy. Thyroid:  Symmetrical, normal in size, without palpable masses or nodularity. Respiratory  Effort:  Normal  Auscultation:  Clear without wheezing or rhonchi Cardiovascular  Auscultation:  Regular rate, without rubs, murmurs or gallops  Edema/varicosities:  Not grossly evident Abdominal  Soft,nontender, without masses, guarding or rebound.  Liver/spleen:  No organomegaly noted  Hernia:  None appreciated  Skin  Inspection:  Grossly normal  Palpation:  Grossly normal Neurologic/psychiatric  Orientation:  Normal with appropriate conversation.  Mood/affect:  Normal  Genitourinary    Breasts: Examined lying and sitting.     Right: Without masses, retractions, discharge or axillary adenopathy.     Left: Without masses, retractions, discharge or axillary adenopathy.   Inguinal/mons:  Normal without inguinal adenopathy  External genitalia:  Normal  BUS/Urethra/Skene's glands:  Normal  Bladder:  Normal  Vagina:  Wet prep positive for clues, and TNTC bacteria  Cervix:  Normal  Uterus:   normal in size, shape and contour.  Midline and mobile  Adnexa/parametria:     Rt: Without masses or tenderness.   Lt: Without masses or tenderness.  Anus and perineum: Normal  Digital rectal exam: Normal sphincter tone without  palpated masses or tenderness  Assessment/Plan:  42 y.o. SBF G1P1 for annual exam with complaint of discharge.  Bacteria vaginosis Ulcerative colitis-primary care manages labs and meds HSV-rare outbreaks  Plan: MetroGel vaginal cream 1 applicator at bedtime x5, prescription, proper use given and reviewed. Instructed to call if no relief of discharge. SBE's, continue annual mammogram, 3D tomography reviewed and encouraged history of dense breasts. Encouraged regular exercise, calcium rich diet, vitamin D 1000 daily. UA, Pap. Pap normal 2012, new screening guidelines reviewed. Valtrex 500 twice daily 3-5 days as needed.    Harrington Challenger [facility], 2:52 PM 06/15/2013

## 2013-06-15 NOTE — Addendum Note (Signed)
Addended by: Richardson Chiquito on: 06/15/2013 03:50 PM   Modules accepted: Orders

## 2013-06-15 NOTE — Patient Instructions (Signed)

## 2013-06-16 LAB — URINALYSIS W MICROSCOPIC + REFLEX CULTURE
Casts: NONE SEEN
Crystals: NONE SEEN
Glucose, UA: NEGATIVE mg/dL
Leukocytes, UA: NEGATIVE
Nitrite: NEGATIVE
Specific Gravity, Urine: 1.026 (ref 1.005–1.030)
Squamous Epithelial / LPF: NONE SEEN
pH: 5.5 (ref 5.0–8.0)

## 2013-08-03 ENCOUNTER — Telehealth: Payer: Self-pay | Admitting: Gastroenterology

## 2013-08-03 NOTE — Telephone Encounter (Signed)
Discussed with patient there is no generic for Lialda Patient just wants a cheaper med of Meslaimine sent to her pharmacy  Dr Arlyce DiceKaplan Please advise what you want me to send her

## 2013-08-05 NOTE — Telephone Encounter (Signed)
See if there are discount coupons available.

## 2013-08-06 MED ORDER — MESALAMINE ER 250 MG PO CPCR: 250.0000 mg | ORAL_CAPSULE | Freq: Four times a day (QID) | ORAL | Status: AC

## 2013-08-06 NOTE — Telephone Encounter (Signed)
Can she take Pentasa??

## 2013-08-06 NOTE — Telephone Encounter (Signed)
Sent Pentasa to pharmacy

## 2013-08-06 NOTE — Telephone Encounter (Signed)
Begin pentasa 1 g 4 times a day

## 2013-08-20 ENCOUNTER — Ambulatory Visit (INDEPENDENT_AMBULATORY_CARE_PROVIDER_SITE_OTHER): Payer: 59 | Admitting: Gastroenterology

## 2013-08-20 ENCOUNTER — Other Ambulatory Visit (INDEPENDENT_AMBULATORY_CARE_PROVIDER_SITE_OTHER): Payer: 59

## 2013-08-20 ENCOUNTER — Encounter: Payer: Self-pay | Admitting: Gastroenterology

## 2013-08-20 VITALS — BP 110/80 | HR 69 | Ht 61.0 in | Wt 119.0 lb

## 2013-08-20 DIAGNOSIS — Z23 Encounter for immunization: Secondary | ICD-10-CM

## 2013-08-20 DIAGNOSIS — K5190 Ulcerative colitis, unspecified, without complications: Secondary | ICD-10-CM

## 2013-08-20 DIAGNOSIS — K5150 Left sided colitis without complications: Secondary | ICD-10-CM

## 2013-08-20 LAB — BASIC METABOLIC PANEL
BUN: 7 mg/dL (ref 6–23)
CALCIUM: 8.8 mg/dL (ref 8.4–10.5)
CO2: 25 mEq/L (ref 19–32)
CREATININE: 0.7 mg/dL (ref 0.4–1.2)
Chloride: 107 mEq/L (ref 96–112)
GFR: 122.37 mL/min (ref >60.00)
Glucose, Bld: 76 mg/dL (ref 70–99)
Potassium: 3.4 mEq/L — ABNORMAL LOW (ref 3.5–5.1)
Sodium: 139 mEq/L (ref 135–145)

## 2013-08-20 NOTE — Addendum Note (Signed)
Addended by: Marlowe KaysSTALLINGS, Chanler Schreiter M on: 08/20/2013 10:39 AM   Modules accepted: Orders

## 2013-08-20 NOTE — Patient Instructions (Signed)
You will go to the basement for labs today You will come back in 4 and 6 months for a BMET Follow up in 10 months You received a TB Test today You received a Hep A Vaccine today Your next Hep A vaccine is scheduled on 02/18/2014 at 10am

## 2013-08-20 NOTE — Addendum Note (Signed)
Addended by: Marlowe KaysSTALLINGS, Ahmaad Neidhardt M on: 08/20/2013 10:30 AM   Modules accepted: Orders

## 2013-08-20 NOTE — Assessment & Plan Note (Signed)
Patient is in clinical remission on lialda.  Plan to switch to Pentasa because of cost considerations.  I will also repeat basic metabolic profile today,  and in 10 months.

## 2013-08-20 NOTE — Progress Notes (Signed)
          History of Present Illness:  The patient has returned for followup of left-sided colitis.  On lialda she is symptom-free.  She has no GI complaints.    Review of Systems: Pertinent positive and negative review of systems were noted in the above HPI section. All other review of systems were otherwise negative.    Current Medications, Allergies, Past Medical History, Past Surgical History, Family History and Social History were reviewed in [facility]  Vital signs were reviewed in today's medical record. Physical Exam: General: Well developed , well nourished, no acute distress Skin: anicteric   See Assessment and Plan under Problem List

## 2013-08-22 NOTE — Addendum Note (Signed)
Addended by: Marlowe KaysSTALLINGS, Laythan Hayter M on: 08/22/2013 02:59 PM   Modules accepted: Orders

## 2014-01-08 ENCOUNTER — Telehealth: Payer: Self-pay | Admitting: *Deleted

## 2014-01-08 NOTE — Telephone Encounter (Signed)
Message copied by Marlowe KaysSTALLINGS, Starletta Houchin M on Tue Jan 08, 2014  4:51 PM ------      Message from: Marlowe KaysSTALLINGS, Astin Rape M      Created: Wed Aug 22, 2013  2:59 PM      Regarding: labs 4-6 months BMET       Make sure pt had BMET 4-6 months ------

## 2014-02-14 ENCOUNTER — Other Ambulatory Visit: Payer: Self-pay | Admitting: *Deleted

## 2014-02-14 NOTE — Telephone Encounter (Signed)
Mailing patient a letter today to come in to have her labs drawn

## 2014-04-29 ENCOUNTER — Encounter: Payer: Self-pay | Admitting: Gastroenterology

## 2015-11-14 ENCOUNTER — Encounter: Payer: Self-pay | Admitting: Physician Assistant

## 2015-11-14 ENCOUNTER — Ambulatory Visit: Payer: Self-pay | Admitting: Physician Assistant

## 2015-11-14 VITALS — BP 120/80 | HR 80 | Temp 98.9°F

## 2015-11-14 DIAGNOSIS — L663 Perifolliculitis capitis abscedens: Secondary | ICD-10-CM

## 2015-11-14 MED ORDER — SULFAMETHOXAZOLE-TRIMETHOPRIM 800-160 MG PO TABS: 1.0000 | ORAL_TABLET | Freq: Two times a day (BID) | ORAL | Status: DC

## 2015-11-14 MED ORDER — SULFAMETHOXAZOLE-TRIMETHOPRIM 800-160 MG PO TABS: 1.0000 | ORAL_TABLET | Freq: Two times a day (BID) | ORAL | Status: AC

## 2015-11-14 NOTE — Addendum Note (Signed)
Addended by: Joni ReiningSMITH, Romona Murdy K on: 11/14/2015 04:07 PM   Modules accepted: Orders

## 2015-11-14 NOTE — Progress Notes (Signed)
   Subjective:Scalp  folliculitis    Patient ID: Stacy Dunn, female    DOB: Mar 02, 1971, 45 y.o.   MRN: [id number]  HPI Patient c/o of pustular lesion in scalp for 2 weeks. History of flare up of scalp infection. Denies fever or discharge from lesion.    Review of Systems Hyperlipidema, and Anemia    Objective:   Physical Exam: Erythematous papular lesion in scalp.       Assessment & Plan:Folliculitis  Trial of Bactrim DS.  Will consider Dermatology consult if no improvement in 2 weeks.
